# Patient Record
Sex: Male | Born: 1955 | Race: White | Hispanic: No | Marital: Single | Smoking: Former smoker
Health system: Southern US, Community
[De-identification: ages and names within clinical notes are randomized; demographics above are authoritative.]

## PROBLEM LIST (undated history)

## (undated) DIAGNOSIS — B351 Tinea unguium: Secondary | ICD-10-CM

## (undated) DIAGNOSIS — E079 Disorder of thyroid, unspecified: Secondary | ICD-10-CM

## (undated) DIAGNOSIS — C801 Malignant (primary) neoplasm, unspecified: Secondary | ICD-10-CM

## (undated) DIAGNOSIS — N4289 Other specified disorders of prostate: Secondary | ICD-10-CM

## (undated) DIAGNOSIS — Q369 Cleft lip, unilateral: Secondary | ICD-10-CM

## (undated) DIAGNOSIS — K219 Gastro-esophageal reflux disease without esophagitis: Secondary | ICD-10-CM

## (undated) DIAGNOSIS — R569 Unspecified convulsions: Secondary | ICD-10-CM

## (undated) DIAGNOSIS — E039 Hypothyroidism, unspecified: Secondary | ICD-10-CM

## (undated) DIAGNOSIS — N289 Disorder of kidney and ureter, unspecified: Secondary | ICD-10-CM

## (undated) DIAGNOSIS — D696 Thrombocytopenia, unspecified: Secondary | ICD-10-CM

## (undated) DIAGNOSIS — F319 Bipolar disorder, unspecified: Secondary | ICD-10-CM

## (undated) DIAGNOSIS — I10 Essential (primary) hypertension: Secondary | ICD-10-CM

## (undated) HISTORY — DX: Disorder of thyroid, unspecified: E07.9

## (undated) HISTORY — DX: Unspecified convulsions: R56.9

---

## 2017-04-18 ENCOUNTER — Inpatient Hospital Stay: Payer: Medicare Other

## 2017-04-18 ENCOUNTER — Encounter: Payer: Self-pay | Admitting: Oncology

## 2017-04-18 ENCOUNTER — Inpatient Hospital Stay: Payer: Medicare Other | Attending: Oncology | Admitting: Oncology

## 2017-04-18 VITALS — BP 126/85 | HR 75 | Temp 97.5°F | Resp 18 | Ht 62.0 in | Wt 127.0 lb

## 2017-04-18 DIAGNOSIS — Z87891 Personal history of nicotine dependence: Secondary | ICD-10-CM | POA: Diagnosis not present

## 2017-04-18 DIAGNOSIS — D696 Thrombocytopenia, unspecified: Secondary | ICD-10-CM

## 2017-04-18 LAB — FOLATE: Folate: 20.9 ng/mL (ref >5.9)

## 2017-04-18 LAB — IRON AND TIBC
Iron: 57 ug/dL (ref 45–182)
Saturation Ratios: 20 % (ref 17.9–39.5)
TIBC: 282 ug/dL (ref 250–450)
UIBC: 225 ug/dL

## 2017-04-18 LAB — CBC
HEMATOCRIT: 40.7 % (ref 40.0–52.0)
Hemoglobin: 14.1 g/dL (ref 13.0–18.0)
MCH: 31.6 pg (ref 26.0–34.0)
MCHC: 34.5 g/dL (ref 32.0–36.0)
MCV: 91.6 fL (ref 80.0–100.0)
Platelets: 125 10*3/uL — ABNORMAL LOW (ref 150–440)
RBC: 4.44 MIL/uL (ref 4.40–5.90)
RDW: 13.7 % (ref 11.5–14.5)
WBC: 6.1 10*3/uL (ref 3.8–10.6)

## 2017-04-18 LAB — VITAMIN B12: Vitamin B-12: 304 pg/mL (ref 180–914)

## 2017-04-18 LAB — FERRITIN: Ferritin: 775 ng/mL — ABNORMAL HIGH (ref 24–336)

## 2017-04-18 NOTE — Progress Notes (Signed)
[place]  [phone] [phone]  ID: Megan Salon OB: May 06, 1955  MR#: [id number]  GUY#:[id number]  Patient Care Team: Sharyne Peach, MD as PCP - General (Family Medicine)  CHIEF COMPLAINT: Thrombocytopenia.  INTERVAL HISTORY: Patient is a 62 year old male who was noted to have a persistently decreased platelet count on routine blood work.  He currently feels well and is asymptomatic.  He has no neurologic complaints.  He denies any easy bleeding or bruising.  He has a good appetite and denies weight loss.  He denies any recent fevers or illnesses.  He has no chest pain or shortness of breath.  He denies any nausea, vomiting, constipation, or diarrhea.  He has no urinary complaints.  Patient feels at his baseline offers no specific complaints today.  REVIEW OF SYSTEMS:   Review of Systems  Constitutional: Negative.  Negative for fever, malaise/fatigue and weight loss.  Respiratory: Negative.  Negative for cough, hemoptysis and shortness of breath.   Cardiovascular: Negative.  Negative for chest pain.  Gastrointestinal: Negative.  Negative for abdominal pain, blood in stool and melena.  Genitourinary: Negative.  Negative for dysuria.  Musculoskeletal: Negative.   Skin: Negative.  Negative for rash.  Neurological: Negative.  Negative for sensory change, focal weakness and weakness.  Endo/Heme/Allergies: Does not bruise/bleed easily.  Psychiatric/Behavioral: Negative.  The patient is not nervous/anxious.     As per HPI. Otherwise, a complete review of systems is negative.  PAST MEDICAL HISTORY: Past Medical History:  Diagnosis Date  . Seizures ([place])   . Thyroid disease     PAST SURGICAL HISTORY: History reviewed. No pertinent surgical history.  FAMILY HISTORY: History reviewed. No pertinent family history.  ADVANCED DIRECTIVES (Y/N):  N  HEALTH MAINTENANCE: Social History   Tobacco Use  . Smoking status: Former Smoker   Types: Cigarettes  . Smokeless tobacco: Never Used  Substance Use Topics  . Alcohol use: Not on file  . Drug use: Not on file     Colonoscopy:  PAP:  Bone density:  Lipid panel:  Not on File  Current Outpatient Medications  Medication Sig Dispense Refill  . aspirin EC 81 MG tablet Take 81 mg by mouth daily.    Marland Kitchen atenolol (TENORMIN) 25 MG tablet Take 12.5 mg by mouth daily.    . divalproex (DEPAKOTE) 500 MG DR tablet Take 500 mg by mouth 2 (two) times daily.    . finasteride (PROSCAR) 5 MG tablet Take 5 mg by mouth daily.    Marland Kitchen levothyroxine (SYNTHROID, LEVOTHROID) 100 MCG tablet Take 100 mcg by mouth daily before breakfast.    . OLANZapine (ZYPREXA) 15 MG tablet Take 15 mg by mouth at bedtime.    Marland Kitchen omeprazole (PRILOSEC) 20 MG capsule Take 20 mg by mouth daily.    . ranitidine (ZANTAC) 300 MG capsule Take 300 mg by mouth every evening.    . tamsulosin (FLOMAX) 0.4 MG CAPS capsule Take 0.4 mg by mouth.     No current facility-administered medications for this visit.     OBJECTIVE: Vitals:   04/18/17 1355  BP: 126/85  Pulse: 75  Resp: 18  Temp: (!) 97.5 F (36.4 C)  SpO2: 98%     Body mass index is 23.23 kg/m.    ECOG FS:0 - Asymptomatic  General: Well-developed, well-nourished, no acute distress. Eyes: Pink conjunctiva, anicteric sclera. HEENT: Normocephalic, moist mucous membranes, clear oropharnyx. Lungs: Clear to auscultation bilaterally. Heart: Regular rate and rhythm. No rubs, murmurs, or gallops. Abdomen:  Soft, nontender, nondistended. No organomegaly noted, normoactive bowel sounds. Musculoskeletal: No edema, cyanosis, or clubbing. Neuro: Alert, answering all questions appropriately. Cranial nerves grossly intact. Skin: No rashes or petechiae noted. Psych: Normal affect. Lymphatics: No cervical, calvicular, axillary or inguinal LAD.   LAB RESULTS:  No results found for: NA, K, CL, CO2, GLUCOSE, BUN, CREATININE, CALCIUM, PROT, ALBUMIN, AST, ALT, ALKPHOS,  BILITOT, GFRNONAA, GFRAA  Lab Results  Component Value Date   WBC 6.1 04/18/2017   HGB 14.1 04/18/2017   HCT 40.7 04/18/2017   MCV 91.6 04/18/2017   PLT 125 (L) 04/18/2017     STUDIES: No results found.  ASSESSMENT: Thrombocytopenia.  PLAN:    1.  Thrombocytopenia: Upon review of the chart, patient's platelets have been decreased, but relatively stable since at least June 2007 ranging from 108-140s.  More recently the platelet count has trended down and is now 91.  Patient's platelet count from today is back up to his baseline at 125.  Iron stores, B12, and folate are all within normal limits.  Platelet antibody panel is negative.  No intervention is needed at this time.  It is possible patient has mild underlying ITP, but this would require bone marrow biopsy to diagnose which is not necessary at this point.  Can consider splenic ultrasound to assess for splenomegaly.  Return to clinic in 3 months with repeat laboratory work and further evaluation.  If his platelet count remains at his baseline, he likely can be discharged from clinic.  Approximately 45 minutes was spent in discussion of which greater than 50% was consultation.  Patient expressed understanding and was in agreement with this plan. He also understands that He can call clinic at any time with any questions, concerns, or complaints.    Lloyd Huger, MD   04/23/2017 11:27 AM

## 2017-04-19 ENCOUNTER — Encounter: Payer: Medicare Other | Admitting: Oncology

## 2017-04-19 LAB — PLATELET ANTIBODY PROFILE
GLYCOPROTEIN IV ANTIBODY: NEGATIVE
HLA Ab Ser Ql EIA: NEGATIVE
IA/IIA ANTIBODY: NEGATIVE
IB/IX Antibody: NEGATIVE
IIB/IIIA Antibody: NEGATIVE

## 2017-08-11 NOTE — Progress Notes (Signed)
[place]  [phone] [phone]  ID: Jud Fanguy OB: 1955-12-22  MR#: [id number]  NID#:[id number]  Patient Care Team: Sharyne Peach, MD as PCP - General (Family Medicine)  CHIEF COMPLAINT: Thrombocytopenia.  INTERVAL HISTORY: Patient returns to clinic today for repeat laboratory work and further evaluation.  Continues to feel well and remains asymptomatic. He has no neurologic complaints.  He denies any easy bleeding or bruising.  He has a good appetite and denies weight loss.  He denies any recent fevers or illnesses.  He has no chest pain or shortness of breath.  He denies any nausea, vomiting, constipation, or diarrhea.  He has no urinary complaints.  Patient feels at his baseline offers no specific complaints today.  REVIEW OF SYSTEMS:   Review of Systems  Constitutional: Negative.  Negative for fever, malaise/fatigue and weight loss.  Respiratory: Negative.  Negative for cough, hemoptysis and shortness of breath.   Cardiovascular: Negative.  Negative for chest pain.  Gastrointestinal: Negative.  Negative for abdominal pain, blood in stool and melena.  Genitourinary: Negative.  Negative for dysuria.  Musculoskeletal: Negative.   Skin: Negative.  Negative for rash.  Neurological: Negative.  Negative for sensory change, focal weakness and weakness.  Endo/Heme/Allergies: Does not bruise/bleed easily.  Psychiatric/Behavioral: Negative.  The patient is not nervous/anxious.     As per HPI. Otherwise, a complete review of systems is negative.  PAST MEDICAL HISTORY: Past Medical History:  Diagnosis Date  . Seizures ([place])   . Thyroid disease     PAST SURGICAL HISTORY: History reviewed. No pertinent surgical history.  FAMILY HISTORY: History reviewed. No pertinent family history.  ADVANCED DIRECTIVES (Y/N):  N  HEALTH MAINTENANCE: Social History   Tobacco Use  . Smoking status: Former Smoker    Types: Cigarettes  . Smokeless  tobacco: Never Used  Substance Use Topics  . Alcohol use: Not on file  . Drug use: Not on file     Colonoscopy:  PAP:  Bone density:  Lipid panel:  Not on File  Current Outpatient Medications  Medication Sig Dispense Refill  . aspirin EC 81 MG tablet Take 81 mg by mouth daily.    Marland Kitchen atenolol (TENORMIN) 25 MG tablet Take 12.5 mg by mouth daily.    . divalproex (DEPAKOTE) 500 MG DR tablet Take 500 mg by mouth 2 (two) times daily.    . finasteride (PROSCAR) 5 MG tablet Take 5 mg by mouth daily.    Marland Kitchen levothyroxine (SYNTHROID, LEVOTHROID) 100 MCG tablet Take 100 mcg by mouth daily before breakfast.    . OLANZapine (ZYPREXA) 15 MG tablet Take 15 mg by mouth at bedtime.    Marland Kitchen omeprazole (PRILOSEC) 20 MG capsule Take 20 mg by mouth daily.    . ranitidine (ZANTAC) 300 MG capsule Take 300 mg by mouth every evening.    . tamsulosin (FLOMAX) 0.4 MG CAPS capsule Take 0.4 mg by mouth.     No current facility-administered medications for this visit.     OBJECTIVE: Vitals:   08/12/17 1509  BP: 134/83  Pulse: 69  Resp: 20  Temp: 97.8 F (36.6 C)     Body mass index is 23.54 kg/m.    ECOG FS:0 - Asymptomatic  General: Well-developed, well-nourished, no acute distress. Eyes: Pink conjunctiva, anicteric sclera. HEENT: Normocephalic, moist mucous membranes. Lungs: Clear to auscultation bilaterally. Heart: Regular rate and rhythm. No rubs, murmurs, or gallops. Abdomen: Soft, nontender, nondistended. No organomegaly noted, normoactive bowel sounds. Musculoskeletal: No edema,  cyanosis, or clubbing. Neuro: Alert, answering all questions appropriately. Cranial nerves grossly intact. Skin: No rashes or petechiae noted. Psych: Normal affect.  LAB RESULTS:  No results found for: NA, K, CL, CO2, GLUCOSE, BUN, CREATININE, CALCIUM, PROT, ALBUMIN, AST, ALT, ALKPHOS, BILITOT, GFRNONAA, GFRAA  Lab Results  Component Value Date   WBC 6.2 08/12/2017   HGB 12.6 (L) 08/12/2017   HCT 37.4 (L)  08/12/2017   MCV 93.5 08/12/2017   PLT 111 (L) 08/12/2017     STUDIES: No results found.  ASSESSMENT: Thrombocytopenia.  PLAN:    1.  Thrombocytopenia: Today's platelet count is 111.  Upon review of the chart, patient's platelets have been decreased, but relatively stable since at least June 2007 ranging from 108-140s.  Previously, iron stores, B12, and folate are all within normal limits.  Platelet antibody panel is negative.  No intervention is needed at this time.  It is possible patient has mild underlying ITP, but this would require bone marrow biopsy to diagnose which is not necessary at this point.  Can consider splenic ultrasound to assess for splenomegaly.  Given the chronicity of patient's problem, it was agreed upon that no further follow-up is necessary.  Please continue to monitor CBC once or twice per year and refer patient back if there are any questions or concerns.  I spent a total of 20 minutes face-to-face with the patient of which greater than 50% of the visit was spent in counseling and coordination of care as detailed above.   Patient expressed understanding and was in agreement with this plan. He also understands that He can call clinic at any time with any questions, concerns, or complaints.    Lloyd Huger, MD   08/16/2017 2:14 PM

## 2017-08-12 ENCOUNTER — Inpatient Hospital Stay: Payer: Medicare Other | Attending: Oncology

## 2017-08-12 ENCOUNTER — Inpatient Hospital Stay (HOSPITAL_BASED_OUTPATIENT_CLINIC_OR_DEPARTMENT_OTHER): Payer: Medicare Other | Admitting: Oncology

## 2017-08-12 ENCOUNTER — Encounter: Payer: Self-pay | Admitting: Oncology

## 2017-08-12 VITALS — BP 134/83 | HR 69 | Temp 97.8°F | Resp 20 | Wt 128.7 lb

## 2017-08-12 DIAGNOSIS — D696 Thrombocytopenia, unspecified: Secondary | ICD-10-CM

## 2017-08-12 DIAGNOSIS — Z87891 Personal history of nicotine dependence: Secondary | ICD-10-CM | POA: Insufficient documentation

## 2017-08-12 LAB — CBC
HCT: 37.4 % — ABNORMAL LOW (ref 40.0–52.0)
HEMOGLOBIN: 12.6 g/dL — AB (ref 13.0–18.0)
MCH: 31.6 pg (ref 26.0–34.0)
MCHC: 33.8 g/dL (ref 32.0–36.0)
MCV: 93.5 fL (ref 80.0–100.0)
Platelets: 111 10*3/uL — ABNORMAL LOW (ref 150–440)
RBC: 3.99 MIL/uL — AB (ref 4.40–5.90)
RDW: 13.6 % (ref 11.5–14.5)
WBC: 6.2 10*3/uL (ref 3.8–10.6)

## 2017-08-12 NOTE — Progress Notes (Signed)
Patient denies any concerns today.  

## 2017-11-11 ENCOUNTER — Other Ambulatory Visit: Payer: Self-pay | Admitting: Gastroenterology

## 2017-11-11 DIAGNOSIS — D696 Thrombocytopenia, unspecified: Secondary | ICD-10-CM

## 2017-11-20 ENCOUNTER — Ambulatory Visit
Admission: RE | Admit: 2017-11-20 | Discharge: 2017-11-20 | Disposition: A | Discharge status: Home / Self Care | Payer: Medicare Other | Source: Ambulatory Visit | Attending: Gastroenterology | Admitting: Gastroenterology

## 2017-11-20 DIAGNOSIS — N281 Cyst of kidney, acquired: Secondary | ICD-10-CM | POA: Insufficient documentation

## 2017-11-20 DIAGNOSIS — D696 Thrombocytopenia, unspecified: Secondary | ICD-10-CM | POA: Insufficient documentation

## 2018-01-14 ENCOUNTER — Ambulatory Visit: Payer: Medicare Other | Admitting: Anesthesiology

## 2018-01-14 ENCOUNTER — Ambulatory Visit
Admission: RE | Admit: 2018-01-14 | Discharge: 2018-01-14 | Disposition: A | Discharge status: Home / Self Care | Payer: Medicare Other | Attending: Gastroenterology | Admitting: Gastroenterology

## 2018-01-14 ENCOUNTER — Encounter
Admission: RE | Disposition: A | Discharge status: Home / Self Care | Payer: Self-pay | Source: Home / Self Care | Attending: Gastroenterology

## 2018-01-14 ENCOUNTER — Encounter: Payer: Self-pay | Admitting: Student

## 2018-01-14 ENCOUNTER — Other Ambulatory Visit: Payer: Self-pay

## 2018-01-14 DIAGNOSIS — E039 Hypothyroidism, unspecified: Secondary | ICD-10-CM | POA: Insufficient documentation

## 2018-01-14 DIAGNOSIS — Z79899 Other long term (current) drug therapy: Secondary | ICD-10-CM | POA: Diagnosis not present

## 2018-01-14 DIAGNOSIS — F319 Bipolar disorder, unspecified: Secondary | ICD-10-CM | POA: Diagnosis not present

## 2018-01-14 DIAGNOSIS — R569 Unspecified convulsions: Secondary | ICD-10-CM | POA: Diagnosis not present

## 2018-01-14 DIAGNOSIS — T184XXA Foreign body in colon, initial encounter: Secondary | ICD-10-CM | POA: Diagnosis not present

## 2018-01-14 DIAGNOSIS — I10 Essential (primary) hypertension: Secondary | ICD-10-CM | POA: Insufficient documentation

## 2018-01-14 DIAGNOSIS — X58XXXA Exposure to other specified factors, initial encounter: Secondary | ICD-10-CM | POA: Diagnosis not present

## 2018-01-14 DIAGNOSIS — Z7982 Long term (current) use of aspirin: Secondary | ICD-10-CM | POA: Diagnosis not present

## 2018-01-14 DIAGNOSIS — K219 Gastro-esophageal reflux disease without esophagitis: Secondary | ICD-10-CM | POA: Insufficient documentation

## 2018-01-14 DIAGNOSIS — Z1211 Encounter for screening for malignant neoplasm of colon: Secondary | ICD-10-CM | POA: Diagnosis present

## 2018-01-14 HISTORY — DX: Tinea unguium: B35.1

## 2018-01-14 HISTORY — DX: Unspecified convulsions: R56.9

## 2018-01-14 HISTORY — DX: Cleft lip, unilateral: Q36.9

## 2018-01-14 HISTORY — DX: Disorder of kidney and ureter, unspecified: N28.9

## 2018-01-14 HISTORY — DX: Bipolar disorder, unspecified: F31.9

## 2018-01-14 HISTORY — DX: Gastro-esophageal reflux disease without esophagitis: K21.9

## 2018-01-14 HISTORY — PX: COLONOSCOPY WITH PROPOFOL: SHX5780

## 2018-01-14 HISTORY — DX: Malignant (primary) neoplasm, unspecified: C80.1

## 2018-01-14 HISTORY — DX: Thrombocytopenia, unspecified: D69.6

## 2018-01-14 HISTORY — DX: Other specified disorders of prostate: N42.89

## 2018-01-14 HISTORY — DX: Hypothyroidism, unspecified: E03.9

## 2018-01-14 HISTORY — DX: Essential (primary) hypertension: I10

## 2018-01-14 LAB — CBC WITH DIFFERENTIAL/PLATELET
Abs Immature Granulocytes: 0.02 10*3/uL (ref 0.00–0.07)
Basophils Absolute: 0 10*3/uL (ref 0.0–0.1)
Basophils Relative: 0 %
Eosinophils Absolute: 0.1 10*3/uL (ref 0.0–0.5)
Eosinophils Relative: 2 %
HCT: 38.5 % — ABNORMAL LOW (ref 39.0–52.0)
Hemoglobin: 12.7 g/dL — ABNORMAL LOW (ref 13.0–17.0)
Immature Granulocytes: 0 %
LYMPHS PCT: 31 %
Lymphs Abs: 1.7 10*3/uL (ref 0.7–4.0)
MCH: 31.1 pg (ref 26.0–34.0)
MCHC: 33 g/dL (ref 30.0–36.0)
MCV: 94.1 fL (ref 80.0–100.0)
Monocytes Absolute: 0.7 10*3/uL (ref 0.1–1.0)
Monocytes Relative: 12 %
Neutro Abs: 2.9 10*3/uL (ref 1.7–7.7)
Neutrophils Relative %: 55 %
Platelets: 98 10*3/uL — ABNORMAL LOW (ref 150–400)
RBC: 4.09 MIL/uL — AB (ref 4.22–5.81)
RDW: 12.4 % (ref 11.5–15.5)
WBC: 5.4 10*3/uL (ref 4.0–10.5)
nRBC: 0 % (ref 0.0–0.2)

## 2018-01-14 LAB — PROTIME-INR
INR: 0.99
PROTHROMBIN TIME: 13 s (ref 11.4–15.2)

## 2018-01-14 SURGERY — COLONOSCOPY WITH PROPOFOL
Anesthesia: General

## 2018-01-14 MED ORDER — PROPOFOL 10 MG/ML IV BOLUS
INTRAVENOUS | Status: DC | PRN
  Administered 2018-01-14: 50 mg via INTRAVENOUS

## 2018-01-14 MED ORDER — SODIUM CHLORIDE 0.9 % IV SOLN
INTRAVENOUS | Status: DC
  Administered 2018-01-14: 12:00:00 via INTRAVENOUS

## 2018-01-14 MED ORDER — FENTANYL CITRATE (PF) 100 MCG/2ML IJ SOLN
INTRAMUSCULAR | Status: DC | PRN
  Administered 2018-01-14: 50 ug via INTRAVENOUS

## 2018-01-14 MED ORDER — PROPOFOL 500 MG/50ML IV EMUL
INTRAVENOUS | Status: AC
  Filled 2018-01-14: qty 50

## 2018-01-14 MED ORDER — PROPOFOL 500 MG/50ML IV EMUL
INTRAVENOUS | Status: DC | PRN
  Administered 2018-01-14: 80 ug/kg/min via INTRAVENOUS

## 2018-01-14 MED ORDER — FENTANYL CITRATE (PF) 100 MCG/2ML IJ SOLN
INTRAMUSCULAR | Status: AC
  Filled 2018-01-14: qty 2

## 2018-01-14 NOTE — H&P (Signed)
Outpatient short stay form Pre-procedure 01/14/2018 12:15 PM Lollie Sails MD   Primary Physician: Dr. Salome Holmes  Reason for visit: Colonoscopy  History of present illness: Patient is a 63 year old male presenting today for colonoscopy for colon cancer screening.  Patient does have a history of thrombocytopenia and a single mutation for hereditary hemochromatosis.  Coags checked this morning include platelets at 98 and pro time of 13.0 INR of 0.99.  His neutrophil is about 3.0.  Tolerated his prep well.  He takes no aspirin or blood thinning agent with the exception of 81 mg aspirin that has been held for a week.    Current Facility-Administered Medications:  .  0.9 %  sodium chloride infusion, , Intravenous, Continuous, Lollie Sails, MD .  0.9 %  sodium chloride infusion, , Intravenous, Continuous, Lollie Sails, MD, Last Rate: 20 mL/hr at 01/14/18 1142  Medications Prior to Admission  Medication Sig Dispense Refill Last Dose  . aspirin EC 81 MG tablet Take 81 mg by mouth daily.   Past Week at Unknown time  . atenolol (TENORMIN) 25 MG tablet Take 12.5 mg by mouth daily.   01/14/2018 at Unknown time  . divalproex (DEPAKOTE) 500 MG DR tablet Take 1,000 mg by mouth at bedtime.    01/13/2018 at Unknown time  . famotidine (PEPCID) 20 MG tablet Take 20 mg by mouth 2 (two) times daily.   01/13/2018 at Unknown time  . finasteride (PROSCAR) 5 MG tablet Take 5 mg by mouth daily.   01/14/2018 at lorazepam  . levothyroxine (SYNTHROID, LEVOTHROID) 100 MCG tablet Take 100 mcg by mouth daily before breakfast.   01/14/2018 at Unknown time  . LORazepam (ATIVAN) 2 MG tablet Take 2 mg by mouth every 8 (eight) hours as needed for anxiety.   Past Month at Unknown time  . OLANZapine (ZYPREXA) 15 MG tablet Take 15 mg by mouth at bedtime.   01/13/2018 at Unknown time  . omeprazole (PRILOSEC) 20 MG capsule Take 20 mg by mouth daily.   01/14/2018 at Unknown time  . ranitidine (ZANTAC) 300 MG capsule  Take 300 mg by mouth every evening.   Not Taking at Unknown time  . tamsulosin (FLOMAX) 0.4 MG CAPS capsule Take 0.4 mg by mouth.   Completed Course at Unknown time     Not on File   Past Medical History:  Diagnosis Date  . Bipolar 1 disorder ([place])   . Cancer [facility])    prostate  . Cleft lip   . Cleft lip   . GERD (gastroesophageal reflux disease)   . Hypertension   . Hypothyroidism   . Onychomycosis   . Prostate mass   . Renal insufficiency   . Seizure ([place])   . Seizures ([place])   . Thrombocytopenia ([place])   . Thyroid disease     Review of systems:      Physical Exam    Heart and lungs: Regular rate and rhythm without rub or gallop, lungs are bilaterally clear.    HEENT: Normocephalic atraumatic eyes are anicteric    Other:    Pertinant exam for procedure: Soft nontender nondistended bowel sounds positive normoactive    Planned proceedures: Colonoscopy and indicated procedures. I have discussed the risks benefits and complications of procedures to include not limited to bleeding, infection, perforation and the risk of sedation and the patient wishes to proceed.    Lollie Sails, MD Gastroenterology 01/14/2018  12:15 PM

## 2018-01-14 NOTE — Transfer of Care (Signed)
Immediate Anesthesia Transfer of Care Note  Patient: Dylan Mahoney  Procedure(s) Performed: COLONOSCOPY WITH PROPOFOL (N/A )  Patient Location: PACU  Anesthesia Type:General  Level of Consciousness: awake  Airway & Oxygen Therapy: Patient Spontanous Breathing  Post-op Assessment: Report given to RN  Post vital signs: Reviewed and stable  Last Vitals:  Vitals Value Taken Time  BP    Temp    Pulse    Resp    SpO2      Last Pain:  Vitals:   01/14/18 1119  TempSrc: Tympanic  PainSc: 0-No pain         Complications: No apparent anesthesia complications

## 2018-01-14 NOTE — Anesthesia Post-op Follow-up Note (Signed)
Anesthesia QCDR form completed.        

## 2018-01-14 NOTE — Anesthesia Preprocedure Evaluation (Addendum)
Anesthesia Evaluation  Patient identified by MRN, date of birth, ID band Patient awake    Reviewed: Allergy & Precautions, H&P , NPO status , Patient's Chart, lab work & pertinent test results, reviewed documented beta blocker date and time   History of Anesthesia Complications Negative for: history of anesthetic complications  Airway Mallampati: II   Neck ROM: full    Dental  (+) Edentulous Upper, Edentulous Lower   Pulmonary neg pulmonary ROS, neg sleep apnea, neg COPD, former smoker,    Pulmonary exam normal        Cardiovascular Exercise Tolerance: Good hypertension, On Medications (-) CAD, (-) Past MI, (-) Cardiac Stents and (-) CABG negative cardio ROS Normal cardiovascular exam Rhythm:regular Rate:Normal     Neuro/Psych Seizures -, Well Controlled,  PSYCHIATRIC DISORDERS Bipolar Disorder    GI/Hepatic Neg liver ROS, GERD  Medicated,  Endo/Other  Hypothyroidism   Renal/GU negative Renal ROS  negative genitourinary   Musculoskeletal negative musculoskeletal ROS (+)   Abdominal (+) - obese,   Peds  Hematology negative hematology ROS (+)   Anesthesia Other Findings Past Medical History: No date: Bipolar 1 disorder ([facility]) No date: Cancer ([place])     Comment:  prostate No date: Cleft lip No date: GERD (gastroesophageal reflux disease) No date: Hypertension No date: Hypothyroidism No date: Seizure ([place]) No date: Seizures ([place]) No date: Thrombocytopenia ([place]) No date: Thyroid disease History reviewed. No pertinent surgical history.   Reproductive/Obstetrics negative OB ROS                            Anesthesia Physical Anesthesia Plan  ASA: III  Anesthesia Plan: General   Post-op Pain Management:    Induction: Intravenous  PONV Risk Score and Plan: 1 and Propofol infusion  Airway Management Planned: Natural Airway  Additional Equipment:   Intra-op Plan:    Post-operative Plan:   Informed Consent: I have reviewed the patients History and Physical, chart, labs and discussed the procedure including the risks, benefits and alternatives for the proposed anesthesia with the patient or authorized representative who has indicated his/her understanding and acceptance.     Dental Advisory Given and Consent reviewed with POA  Plan Discussed with: CRNA  Anesthesia Plan Comments:        Anesthesia Quick Evaluation

## 2018-01-14 NOTE — Op Note (Signed)
[facility] Gastroenterology Patient Name: Dylan Mahoney Procedure Date: 01/14/2018 9:23 AM MRN: [id number] Account #: [id number] Date of Birth: May 05, 1955 Admit Type: Outpatient Age: 63 Room: [facility] ENDO ROOM 3 Gender: Male Note Status: Finalized Procedure:            Colonoscopy Indications:          Screening for colorectal malignant neoplasm Providers:            Lollie Sails, MD Medicines:            Monitored Anesthesia Care Complications:        No immediate complications. Procedure:            Pre-Anesthesia Assessment:                       - ASA Grade Assessment: III - A patient with severe                        systemic disease.                       After obtaining informed consent, the colonoscope was                        passed under direct vision. Throughout the procedure,                        the patient's blood pressure, pulse, and oxygen                        saturations were monitored continuously. The                        Colonoscope was introduced through the anus and                        advanced to the the cecum, identified by appendiceal                        orifice and ileocecal valve. The colonoscopy was                        performed without difficulty. The patient tolerated the                        procedure well. The quality of the bowel preparation                        was good. Findings:      The colon (entire examined portion) appeared normal, although the       colonic lumen is somewhat generous, and there is a small /minimal amount       of melanosis in the left colon.      The retroflexed view of the distal rectum and anal verge was normal and       showed no anal or rectal abnormalities.      A foreign body was found in the cecum, this seeming to be a small       chicken rib bone. Impression:           - The entire examined colon is normal.                       -  The distal rectum and anal verge are  normal on                        retroflexion view.                       - Foreign body in the cecum.                       - No specimens collected. Recommendation:       - Discharge patient to home.                       - Soft diet for 3 days, then advance as tolerated to                        advance diet as tolerated. Procedure Code(s):    --- Professional ---                       [phone], Colonoscopy, flexible; diagnostic, including                        collection of specimen(s) by brushing or washing, when                        performed (separate procedure) Diagnosis Code(s):    --- Professional ---                       Z12.11, Encounter for screening for malignant neoplasm                        of colon                       T18.4XXA, Foreign body in colon, initial encounter CPT copyright 2018 American Medical Association. All rights reserved. The codes documented in this report are preliminary and upon coder review may  be revised to meet current compliance requirements. Lollie Sails, MD 01/14/2018 12:57:16 PM This report has been signed electronically. Number of Addenda: 0 Note Initiated On: 01/14/2018 9:23 AM Scope Withdrawal Time: 0 hours 5 minutes 46 seconds  Total Procedure Duration: 0 hours 21 minutes 22 seconds       [facility]

## 2018-01-15 ENCOUNTER — Encounter: Payer: Self-pay | Admitting: Gastroenterology

## 2018-01-15 NOTE — Anesthesia Postprocedure Evaluation (Signed)
Anesthesia Post Note  Patient: Dylan Mahoney  Procedure(s) Performed: COLONOSCOPY WITH PROPOFOL (N/A )  Patient location during evaluation: PACU Anesthesia Type: General Level of consciousness: awake and alert Pain management: pain level controlled Vital Signs Assessment: post-procedure vital signs reviewed and stable Respiratory status: spontaneous breathing, nonlabored ventilation, respiratory function stable and patient connected to nasal cannula oxygen Cardiovascular status: blood pressure returned to baseline and stable Postop Assessment: no apparent nausea or vomiting Anesthetic complications: no     Last Vitals:  Vitals:   01/14/18 1313 01/14/18 1323  BP: 140/80 105/74  Pulse: 75 73  Resp: (!) 22 16  Temp:    SpO2: 100% 98%    Last Pain:  Vitals:   01/14/18 1313  TempSrc:   PainSc: 0-No pain                 Molli Barrows

## 2018-11-28 IMAGING — US US ABDOMEN COMPLETE
1 series · 14 of 25 positions shown · non-contrast
Comparison: None.

CLINICAL DATA: Initial evaluation for thrombocytopenia.

EXAM:
ABDOMEN ULTRASOUND COMPLETE

[Series 1: us abdomen complete · 0.19mm/px · 14 of 82 slices shown]
[im 1/82]
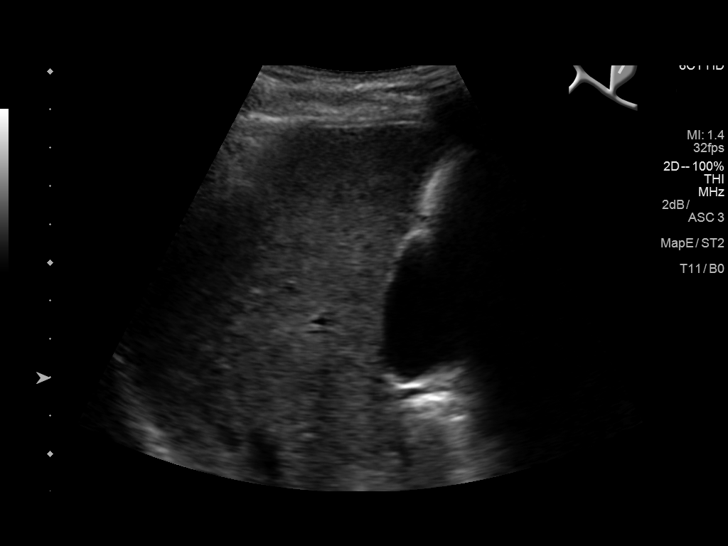
[im 7/82]
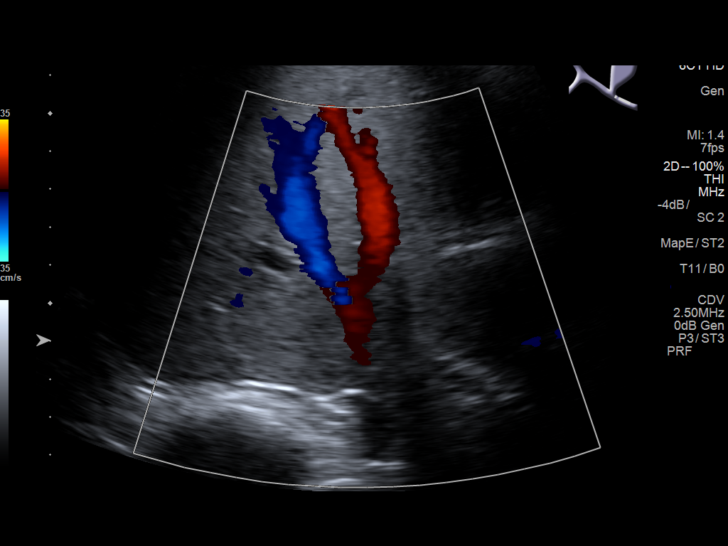
[im 14/82]
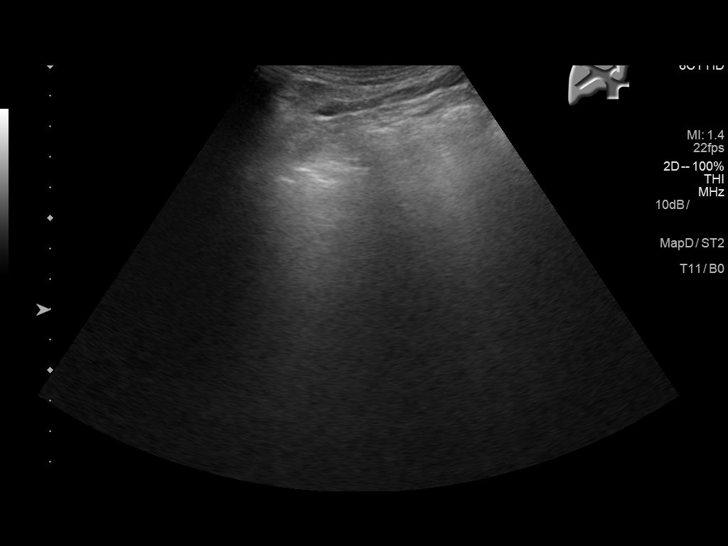
[im 21/82]
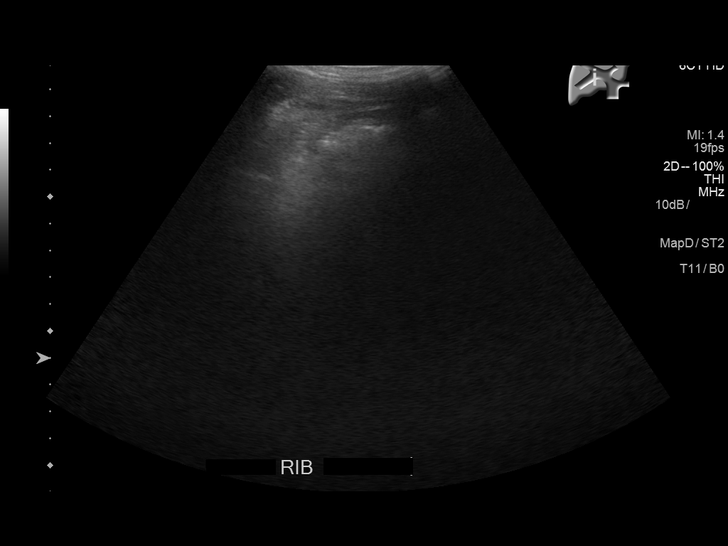
[im 28/82]
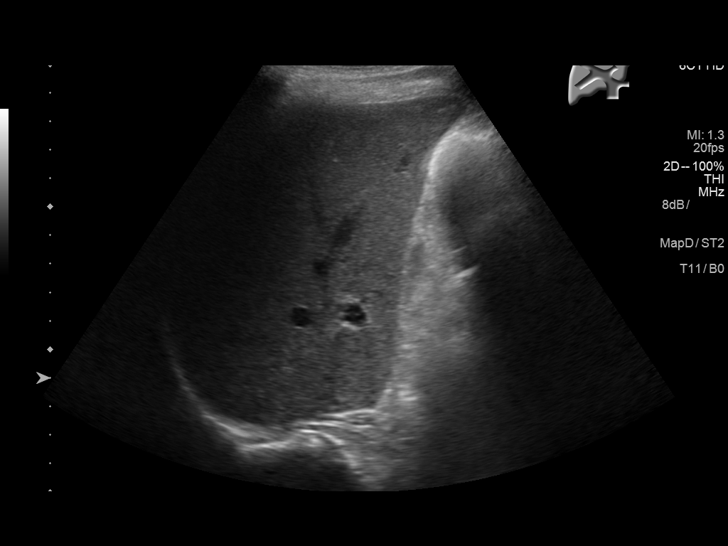
[im 31/82]
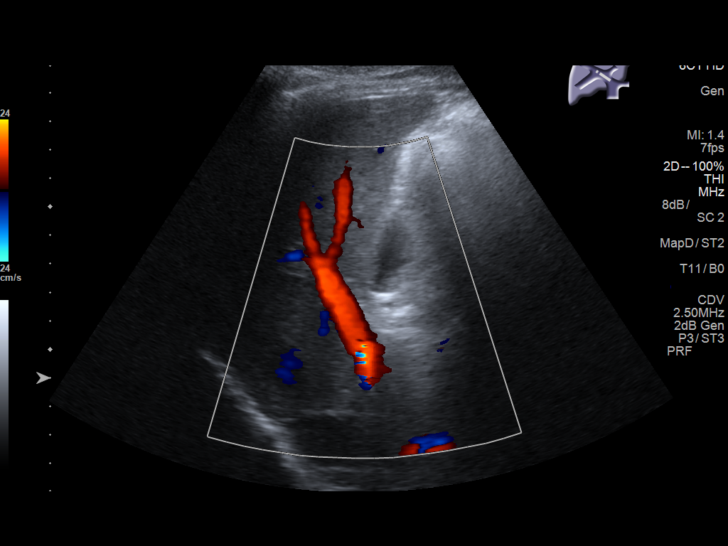
[im 38/82]
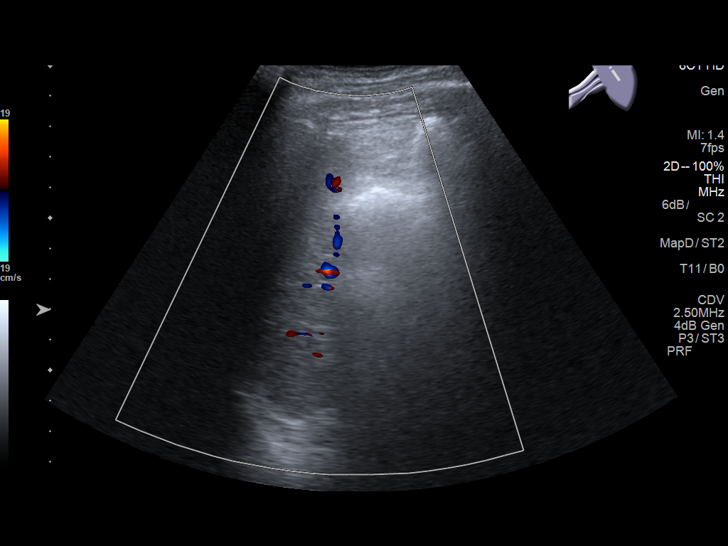
[im 44/82]
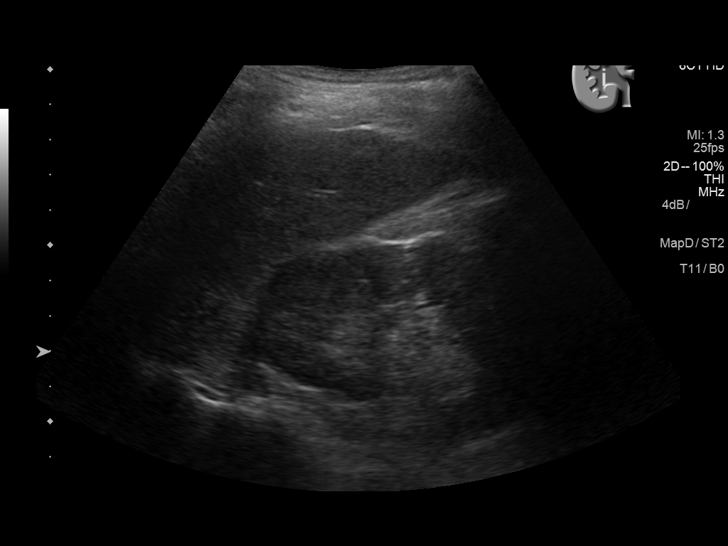
[im 51/82]
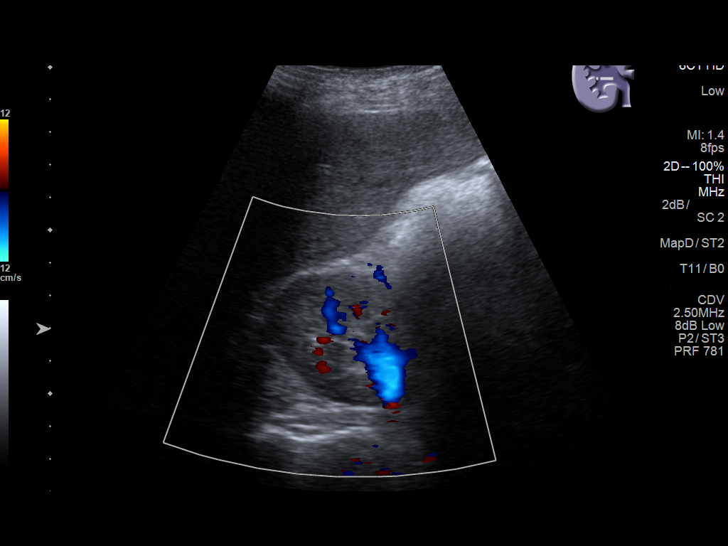
[im 55/82]
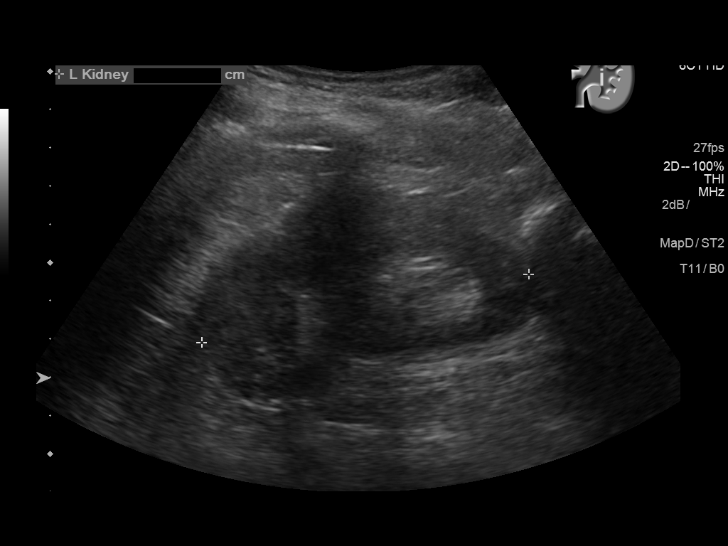
[im 61/82]
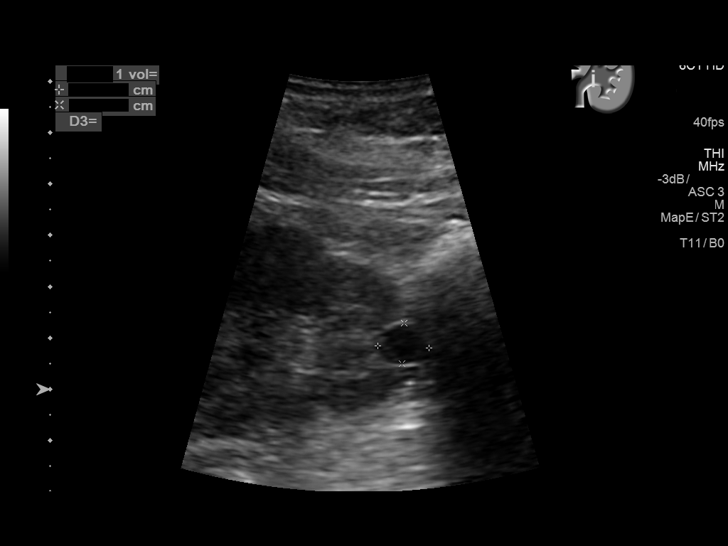
[im 68/82]
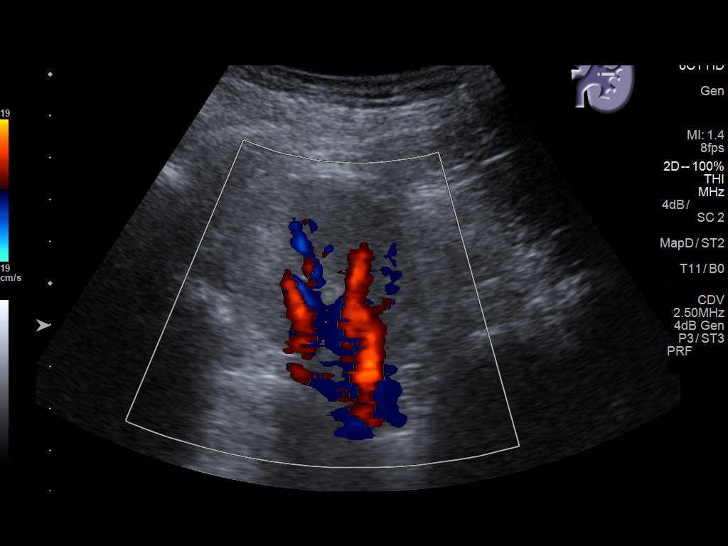
[im 75/82]
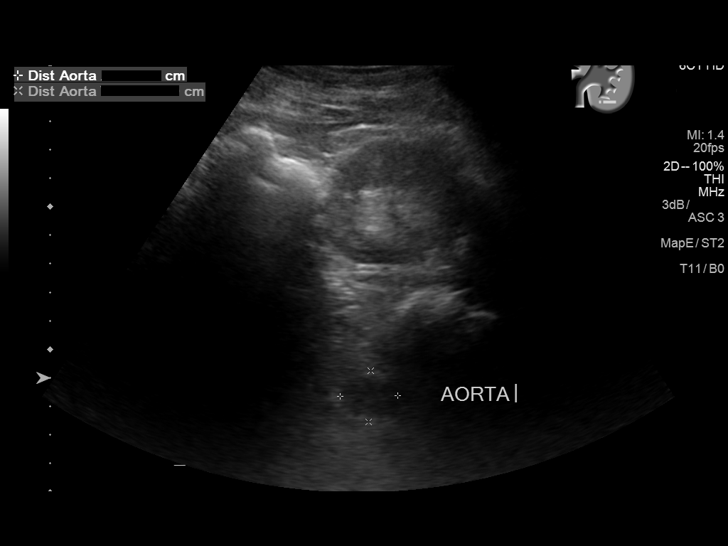
[im 82/82]
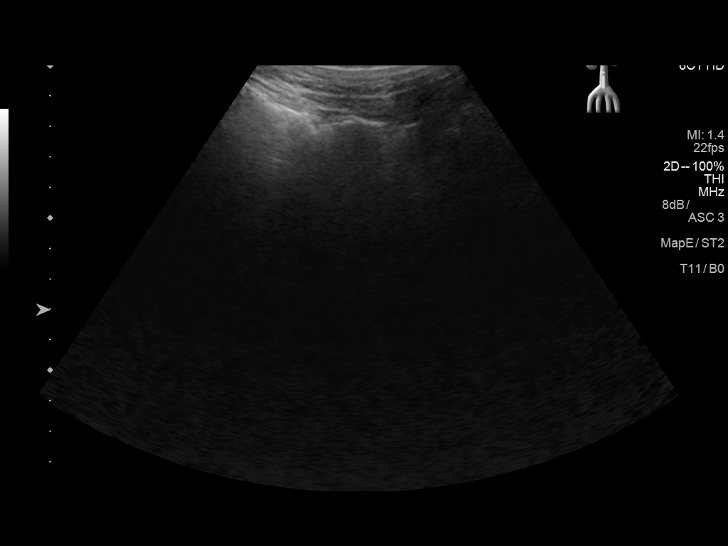

[14 of 25 positions shown; findings below may reference images not displayed]

FINDINGS: Gallbladder: No gallstones or wall thickening visualized. No
sonographic Murphy sign noted by sonographer.

Common bile duct: Diameter: 2.5 mm

Liver: No focal lesion identified. Within normal limits in
parenchymal echogenicity. Portal vein is patent on color Doppler
imaging with normal direction of blood flow towards the liver.

IVC: No abnormality visualized.

Pancreas: Not visualized due to overlying bowel gas.

Spleen: Size and appearance within normal limits.

Right Kidney: Length: 9.1 cm. Echogenicity within normal limits. No
mass or hydronephrosis visualized.

Left Kidney: Length: 8.7 cm. Echogenicity within normal limits. No
hydronephrosis. 1.0 x 0.8 x 0.8 cm simple cyst present at the lower
pole.

Abdominal aorta: No aneurysm visualized.

Other findings: None.
IMPRESSION: 1. 1 cm simple left renal cyst.
2. Otherwise unremarkable and normal abdominal ultrasound.

## 2022-09-14 ENCOUNTER — Encounter (INDEPENDENT_AMBULATORY_CARE_PROVIDER_SITE_OTHER): Payer: Self-pay | Admitting: *Deleted

## 2023-03-14 ENCOUNTER — Encounter (INDEPENDENT_AMBULATORY_CARE_PROVIDER_SITE_OTHER): Payer: Self-pay | Admitting: *Deleted

## 2023-09-13 ENCOUNTER — Other Ambulatory Visit (HOSPITAL_COMMUNITY): Payer: Self-pay | Admitting: Gerontology

## 2023-09-13 ENCOUNTER — Ambulatory Visit (HOSPITAL_COMMUNITY)
Admission: RE | Admit: 2023-09-13 | Discharge: 2023-09-13 | Disposition: A | Discharge status: Home / Self Care | Source: Ambulatory Visit | Attending: Gerontology | Admitting: Gerontology

## 2023-09-13 DIAGNOSIS — R52 Pain, unspecified: Secondary | ICD-10-CM | POA: Insufficient documentation

## 2023-12-26 ENCOUNTER — Other Ambulatory Visit: Payer: Self-pay

## 2023-12-26 ENCOUNTER — Emergency Department (HOSPITAL_COMMUNITY)

## 2023-12-26 ENCOUNTER — Inpatient Hospital Stay (HOSPITAL_COMMUNITY)
Admission: EM | Admit: 2023-12-26 | Discharge: 2024-01-01 | DRG: 193 | Disposition: A | Discharge status: Skilled Nursing Facility | Source: Skilled Nursing Facility | Attending: Internal Medicine | Admitting: Internal Medicine

## 2023-12-26 ENCOUNTER — Encounter (HOSPITAL_COMMUNITY): Payer: Self-pay

## 2023-12-26 DIAGNOSIS — F259 Schizoaffective disorder, unspecified: Secondary | ICD-10-CM | POA: Diagnosis present

## 2023-12-26 DIAGNOSIS — Z79890 Hormone replacement therapy (postmenopausal): Secondary | ICD-10-CM

## 2023-12-26 DIAGNOSIS — K219 Gastro-esophageal reflux disease without esophagitis: Secondary | ICD-10-CM | POA: Diagnosis present

## 2023-12-26 DIAGNOSIS — E039 Hypothyroidism, unspecified: Secondary | ICD-10-CM | POA: Diagnosis present

## 2023-12-26 DIAGNOSIS — F319 Bipolar disorder, unspecified: Secondary | ICD-10-CM | POA: Diagnosis present

## 2023-12-26 DIAGNOSIS — I10 Essential (primary) hypertension: Secondary | ICD-10-CM | POA: Diagnosis present

## 2023-12-26 DIAGNOSIS — W19XXXA Unspecified fall, initial encounter: Secondary | ICD-10-CM | POA: Diagnosis present

## 2023-12-26 DIAGNOSIS — Z87891 Personal history of nicotine dependence: Secondary | ICD-10-CM

## 2023-12-26 DIAGNOSIS — Z87730 Personal history of (corrected) cleft lip and palate: Secondary | ICD-10-CM

## 2023-12-26 DIAGNOSIS — Z7982 Long term (current) use of aspirin: Secondary | ICD-10-CM

## 2023-12-26 DIAGNOSIS — J101 Influenza due to other identified influenza virus with other respiratory manifestations: Principal | ICD-10-CM | POA: Diagnosis present

## 2023-12-26 DIAGNOSIS — R6510 Systemic inflammatory response syndrome (SIRS) of non-infectious origin without acute organ dysfunction: Secondary | ICD-10-CM | POA: Diagnosis present

## 2023-12-26 DIAGNOSIS — R0902 Hypoxemia: Secondary | ICD-10-CM

## 2023-12-26 DIAGNOSIS — Z79899 Other long term (current) drug therapy: Secondary | ICD-10-CM | POA: Diagnosis not present

## 2023-12-26 DIAGNOSIS — J9601 Acute respiratory failure with hypoxia: Secondary | ICD-10-CM | POA: Diagnosis present

## 2023-12-26 DIAGNOSIS — Y92129 Unspecified place in nursing home as the place of occurrence of the external cause: Secondary | ICD-10-CM

## 2023-12-26 DIAGNOSIS — J111 Influenza due to unidentified influenza virus with other respiratory manifestations: Principal | ICD-10-CM

## 2023-12-26 LAB — COMPREHENSIVE METABOLIC PANEL WITH GFR
ALT: 51 U/L — ABNORMAL HIGH (ref 0–44)
AST: 76 U/L — ABNORMAL HIGH (ref 15–41)
Albumin: 3.9 g/dL (ref 3.5–5.0)
Alkaline Phosphatase: 57 U/L (ref 38–126)
Anion gap: 14 (ref 5–15)
BUN: 21 mg/dL (ref 8–23)
CO2: 23 mmol/L (ref 22–32)
Calcium: 8.8 mg/dL — ABNORMAL LOW (ref 8.9–10.3)
Chloride: 102 mmol/L (ref 98–111)
Creatinine, Ser: 1.45 mg/dL — ABNORMAL HIGH (ref 0.61–1.24)
GFR, Estimated: 53 mL/min — ABNORMAL LOW
Glucose, Bld: 111 mg/dL — ABNORMAL HIGH (ref 70–99)
Potassium: 4.5 mmol/L (ref 3.5–5.1)
Sodium: 139 mmol/L (ref 135–145)
Total Bilirubin: 0.4 mg/dL (ref 0.0–1.2)
Total Protein: 7.4 g/dL (ref 6.5–8.1)

## 2023-12-26 LAB — CBC
HCT: 41.8 % (ref 39.0–52.0)
Hemoglobin: 13.3 g/dL (ref 13.0–17.0)
MCH: 30.1 pg (ref 26.0–34.0)
MCHC: 31.8 g/dL (ref 30.0–36.0)
MCV: 94.6 fL (ref 80.0–100.0)
Platelets: 112 K/uL — ABNORMAL LOW (ref 150–400)
RBC: 4.42 MIL/uL (ref 4.22–5.81)
RDW: 15.2 % (ref 11.5–15.5)
WBC: 8.2 K/uL (ref 4.0–10.5)
nRBC: 0 % (ref 0.0–0.2)

## 2023-12-26 LAB — URINALYSIS, ROUTINE W REFLEX MICROSCOPIC
Bilirubin Urine: NEGATIVE
Glucose, UA: NEGATIVE mg/dL
Hgb urine dipstick: NEGATIVE
Ketones, ur: NEGATIVE mg/dL
Leukocytes,Ua: NEGATIVE
Nitrite: NEGATIVE
Protein, ur: NEGATIVE mg/dL
Specific Gravity, Urine: 1.011 (ref 1.005–1.030)
pH: 5 (ref 5.0–8.0)

## 2023-12-26 LAB — LACTIC ACID, PLASMA
Lactic Acid, Venous: 1.7 mmol/L (ref 0.5–1.9)
Lactic Acid, Venous: 1.1 mmol/L (ref 0.5–1.9)

## 2023-12-26 LAB — RESP PANEL BY RT-PCR (RSV, FLU A&B, COVID)  RVPGX2
Influenza A by PCR: POSITIVE — AB
Influenza B by PCR: NEGATIVE
Resp Syncytial Virus by PCR: NEGATIVE
SARS Coronavirus 2 by RT PCR: NEGATIVE

## 2023-12-26 MED ORDER — IPRATROPIUM-ALBUTEROL 0.5-2.5 (3) MG/3ML IN SOLN
3.0000 mL | Freq: Once | RESPIRATORY_TRACT | Status: AC
  Administered 2023-12-26: 3 mL via RESPIRATORY_TRACT
  Filled 2023-12-26: qty 3

## 2023-12-26 MED ORDER — ACETAMINOPHEN 500 MG PO TABS
1000.0000 mg | ORAL_TABLET | Freq: Once | ORAL | Status: AC
  Administered 2023-12-26: 1000 mg via ORAL
  Filled 2023-12-26: qty 2

## 2023-12-26 MED ORDER — GUAIFENESIN ER 600 MG PO TB12
600.0000 mg | ORAL_TABLET | Freq: Two times a day (BID) | ORAL | Status: DC
  Administered 2023-12-26 – 2024-01-01 (×13): 600 mg via ORAL
  Filled 2023-12-26 (×10): qty 1

## 2023-12-26 MED ORDER — SENNOSIDES-DOCUSATE SODIUM 8.6-50 MG PO TABS: 1.0000 | ORAL_TABLET | Freq: Every evening | ORAL | Status: DC | PRN

## 2023-12-26 MED ORDER — ONDANSETRON HCL 4 MG PO TABS
4.0000 mg | ORAL_TABLET | Freq: Four times a day (QID) | ORAL | Status: DC | PRN
  Administered 2024-01-01: 4 mg via ORAL

## 2023-12-26 MED ORDER — ACETAMINOPHEN 650 MG RE SUPP: 650.0000 mg | Freq: Four times a day (QID) | RECTAL | Status: DC | PRN

## 2023-12-26 MED ORDER — SODIUM CHLORIDE 0.9 % IV SOLN
500.0000 mg | INTRAVENOUS | Status: DC
  Administered 2023-12-26 – 2023-12-28 (×3): 500 mg via INTRAVENOUS
  Filled 2023-12-26 (×3): qty 5

## 2023-12-26 MED ORDER — BISACODYL 5 MG PO TBEC: 5.0000 mg | DELAYED_RELEASE_TABLET | Freq: Every day | ORAL | Status: DC | PRN

## 2023-12-26 MED ORDER — ONDANSETRON HCL 4 MG/2ML IJ SOLN
4.0000 mg | Freq: Four times a day (QID) | INTRAMUSCULAR | Status: DC | PRN
  Administered 2023-12-30: 4 mg via INTRAVENOUS

## 2023-12-26 MED ORDER — OSELTAMIVIR PHOSPHATE 30 MG PO CAPS
30.0000 mg | ORAL_CAPSULE | Freq: Once | ORAL | Status: AC
  Administered 2023-12-26: 30 mg via ORAL
  Filled 2023-12-26: qty 1

## 2023-12-26 MED ORDER — ACETAMINOPHEN 325 MG PO TABS: 650.0000 mg | ORAL_TABLET | Freq: Four times a day (QID) | ORAL | Status: DC | PRN

## 2023-12-26 MED ORDER — HYDRALAZINE HCL 20 MG/ML IJ SOLN
10.0000 mg | Freq: Four times a day (QID) | INTRAMUSCULAR | Status: DC | PRN
  Administered 2023-12-30 – 2023-12-31 (×2): 10 mg via INTRAVENOUS

## 2023-12-26 MED ORDER — OSELTAMIVIR PHOSPHATE 30 MG PO CAPS
30.0000 mg | ORAL_CAPSULE | Freq: Two times a day (BID) | ORAL | Status: DC
  Administered 2023-12-27 – 2023-12-29 (×6): 30 mg via ORAL
  Filled 2023-12-26 (×7): qty 1

## 2023-12-26 MED ORDER — LACTATED RINGERS IV BOLUS
1000.0000 mL | Freq: Once | INTRAVENOUS | Status: AC
  Administered 2023-12-26: 1000 mL via INTRAVENOUS

## 2023-12-26 MED ORDER — SODIUM CHLORIDE 0.9 % IV SOLN
1.0000 g | INTRAVENOUS | Status: DC
  Administered 2023-12-26 – 2023-12-27 (×2): 1 g via INTRAVENOUS
  Filled 2023-12-26 (×2): qty 10

## 2023-12-26 MED ORDER — ALBUTEROL SULFATE (2.5 MG/3ML) 0.083% IN NEBU
2.5000 mg | INHALATION_SOLUTION | RESPIRATORY_TRACT | Status: DC | PRN
  Administered 2023-12-30: 2.5 mg via RESPIRATORY_TRACT
  Filled 2023-12-26: qty 3

## 2023-12-26 MED ORDER — HEPARIN SODIUM (PORCINE) 5000 UNIT/ML IJ SOLN
5000.0000 [IU] | Freq: Three times a day (TID) | INTRAMUSCULAR | Status: DC
  Administered 2023-12-26 – 2024-01-01 (×18): 5000 [IU] via SUBCUTANEOUS
  Filled 2023-12-26 (×13): qty 1

## 2023-12-26 NOTE — Final Progress Note (Signed)
 Spoke to med tech at [facility], [phone]. She stated legal guardians are [name] [phone] and Gwenn Loa Farr [phone]. Med tech stated they were aware of his admission to the hospital.

## 2023-12-26 NOTE — ED Triage Notes (Signed)
 Patient BIB [facility] from [place]. Called out for a fall unwitnessed. Has a cough and weakness chronic back pain. Temp 102.4 axillary, 115 pulse 150 systolic. 1 gram of rocephin . 93% room air 2 L oxygen placed and o2 bumped up to 96%.

## 2023-12-26 NOTE — ED Provider Notes (Signed)
 " [place] EMERGENCY DEPARTMENT AT [facility] Provider Note   CSN: [id number] Arrival date & time: 12/26/23  9086     Patient presents with: Fall and Weakness   Dylan Mahoney is a 68 y.o. male.   68 year old male with past medical history of hypertension and schizoaffective disorder presenting to the emergency department today after an unwitnessed fall and some generalized weakness.  The patient was at his facility when he apparently fell.  The patient is a difficult historian.  States that he has been feeling unwell with a cough over the past few days.  He states that he is feeling weak.  He is unsure if he hit his head but does not think that he did when he fell.  Denies any other complaints at this time.   Fall  Weakness Associated symptoms: cough        Prior to Admission medications  Medication Sig Start Date End Date Taking? Authorizing Provider  aspirin EC 81 MG tablet Take 81 mg by mouth daily.    [provider]  atenolol (TENORMIN) 25 MG tablet Take 12.5 mg by mouth daily.    [provider]  divalproex (DEPAKOTE) 500 MG DR tablet Take 1,000 mg by mouth at bedtime.     [provider]  famotidine (PEPCID) 20 MG tablet Take 20 mg by mouth 2 (two) times daily.    [provider]  finasteride (PROSCAR) 5 MG tablet Take 5 mg by mouth daily.    [provider]  levothyroxine (SYNTHROID, LEVOTHROID) 100 MCG tablet Take 100 mcg by mouth daily before breakfast.    [provider]  LORazepam (ATIVAN) 2 MG tablet Take 2 mg by mouth every 8 (eight) hours as needed for anxiety.    [provider]  OLANZapine (ZYPREXA) 15 MG tablet Take 15 mg by mouth at bedtime.    [provider]  omeprazole (PRILOSEC) 20 MG capsule Take 20 mg by mouth daily.    [provider]  ranitidine (ZANTAC) 300 MG capsule Take 300 mg by mouth every evening.    [provider]  tamsulosin (FLOMAX) 0.4 MG  CAPS capsule Take 0.4 mg by mouth.    [provider]    Allergies: Patient has no allergy information on record.    Review of Systems  Respiratory:  Positive for cough.   Neurological:  Positive for weakness.  All other systems reviewed and are negative.   Updated Vital Signs BP (!) 150/84   Pulse (!) 116   Temp 98 F (36.7 C) (Oral)   Resp (!) 29   Ht 5' 5 (1.651 m)   Wt 94.3 kg   SpO2 93%   BMI 34.61 kg/m   Physical Exam Vitals and nursing note reviewed.   Gen: Ill-appearing but nontoxic Eyes: PERRL, EOMI HEENT: no oropharyngeal swelling Neck: trachea midline, no cervical spine tenderness, no stepoffs or deformities Resp: Coarse breath sounds at bilateral lung bases Card: RRR, no murmurs, rubs, or gallops Abd: nontender, nondistended, no seatbelt sign Extremities: no calf tenderness, no edema MSK: no thoracic spinal tenderness, no lumbar spinal tenderness, no step-offs or deformities Vascular: 2+ radial pulses bilaterally, 2+ DP pulses bilaterally Neuro: Alert and oriented x 3, equal strength sensation throughout bilateral upper and lower extremities Skin: no rashes    (all labs ordered are listed, but only abnormal results are displayed) Labs Reviewed  RESP PANEL BY RT-PCR (RSV, FLU A&B, COVID)  [id number] - Abnormal; Notable for  the following components:      Result Value   Influenza A by PCR POSITIVE (*)    All other components within normal limits  COMPREHENSIVE METABOLIC PANEL WITH GFR - Abnormal; Notable for the following components:   Glucose, Bld 111 (*)    Creatinine, Ser 1.45 (*)    Calcium 8.8 (*)    AST 76 (*)    ALT 51 (*)    GFR, Estimated 53 (*)    All other components within normal limits  CBC - Abnormal; Notable for the following components:   Platelets 112 (*)    All other components within normal limits  CULTURE, BLOOD (ROUTINE X 2)  CULTURE, BLOOD (ROUTINE X 2)  URINALYSIS, ROUTINE W REFLEX MICROSCOPIC  LACTIC ACID, PLASMA   LACTIC ACID, PLASMA  CBG MONITORING, ED    EKG: EKG Interpretation Date/Time:  Thursday December 26 2023 10:12:36 EST Ventricular Rate:  111 PR Interval:    QRS Duration:  87 QT Interval:  310 QTC Calculation: 422 R Axis:   1  Text Interpretation: Sinus tachycardia Nonspecific ST abnormality Confirmed by Ula Barter [phone]) on 12/26/2023 10:26:42 AM  Radiology: DG Chest Portable 1 View Result Date: 12/26/2023 EXAM: 1 VIEW(S) XRAY OF THE CHEST 12/26/2023 01:06:00 PM COMPARISON: None available. CLINICAL HISTORY: sob FINDINGS: LUNGS AND PLEURA: Low lung volumes. Bilateral perihilar interstitial opacities with mild elevation of the right hemidiaphragm. No pleural effusion. No pneumothorax. HEART AND MEDIASTINUM: Borderline cardiomegaly. Aortic atherosclerosis. BONES AND SOFT TISSUES: Multilevel degenerative changes of the thoracic spine. No acute osseous abnormality. IMPRESSION: 1. Low lung volumes with bilateral perihilar interstitial opacities, which may be due to bronchovascular crowding, interstitial edema, or atypical/viral infection, in the correct clinical context. Electronically signed by: Rogelia Myers MD 12/26/2023 01:17 PM EST RP Workstation: [id number]   CT Head Wo Contrast Result Date: 12/26/2023 EXAM: CT HEAD WITHOUT CONTRAST 12/26/2023 11:56:14 AM TECHNIQUE: CT of the head was performed without the administration of intravenous contrast. Automated exposure control, iterative reconstruction, and/or weight based adjustment of the mA/kV was utilized to reduce the radiation dose to as low as reasonably achievable. COMPARISON: None available. CLINICAL HISTORY: 68 year old male with fever, cough, weakness, and unobserved fall. FINDINGS: BRAIN AND VENTRICLES: No acute hemorrhage. No evidence of acute infarct. No hydrocephalus. No extra-axial collection. No mass effect or midline shift. Mild calcified atherosclerosis at the skull base. Chronic lacunar infarct in the ventral left thalamus  (series 2 image 40). Generalized cerebral volume loss seems advanced for age. Questionable disproportionate atrophy also in the bilateral lower cerebellum (series 2 image 18), nonspecific. Maintained gray white differentiation otherwise. No suspicious intracranial vascular hyperdensity. ORBITS: No gaze deviation. SINUSES: Mild to moderate paranasal sinus mucosal thickening primarily affecting the ethmoids. Bubbly opacity in the right frontoethmoidal recess. No layering sinus fluid. Tympanic cavities and mastoids appear clear. SOFT TISSUES AND SKULL: No acute soft tissue abnormality. No skull fracture. IMPRESSION: 1. No acute intracranial abnormality. 2. Nonspecific advanced for age brain volume loss, especially the lower cerebellum. Chronic left thalamic lacunar infarct. 3. Acute paranasal sinus inflammation. Electronically signed by: Helayne Hurst MD 12/26/2023 12:11 PM EST RP Workstation: [id number]     Procedures   Medications Ordered in the ED  azithromycin  (ZITHROMAX ) 500 mg in sodium chloride  0.9 % 250 mL IVPB (500 mg Intravenous New Bag/Given 12/26/23 1105)  oseltamivir  (TAMIFLU ) capsule 30 mg (has no administration in time range)  lactated ringers  bolus 1,000 mL (1,000 mLs Intravenous Bolus 12/26/23 1044)  acetaminophen  (TYLENOL ) tablet 1,000 mg (  1,000 mg Oral Given 12/26/23 1046)  ipratropium-albuterol  (DUONEB) 0.5-2.5 (3) MG/3ML nebulizer solution 3 mL (3 mLs Nebulization Given 12/26/23 1048)                                    Medical Decision Making 68 year old male with past medical history of hypertension and schizoaffective disorder presenting to the emergency department today with cough and generalized weakness after a fall at his nursing facility.  The patient is tachycardic on arrival and febrile with medics.  The patient did receive Rocephin  already.  Will give him azithromycin  as well here.  Will obtain lactic acid and blood cultures as well as an RSV/COVID/flu swab.  Will obtain a  CT scan of his head to eval for traumatic injuries.  I will reevaluate for ultimate disposition.  The patient's labs are largely unremarkable.  His flu test is positive.  The patient does remain on 2 L nasal cannula for hypoxia and is still tachypneic.  He is given renally dosed Tamiflu .  Calls placed to hospitalist service for admission.  Amount and/or Complexity of Data Reviewed Labs: ordered. Radiology: ordered.  Risk OTC drugs. Prescription drug management. Decision regarding hospitalization.        Final diagnoses:  Influenza  Hypoxia    ED Discharge Orders     None          Ula Prentice SAUNDERS, MD 12/26/23 1405  "

## 2023-12-26 NOTE — H&P (Signed)
 " History and Physical    Patient: Dylan Mahoney FMW:[id number] DOB: 1955/12/26 DOA: 12/26/2023 DOS: the patient was seen and examined on 12/26/2023 PCP: [name], MD  Patient coming from: Home  Chief Complaint:  Chief Complaint  Patient presents with   Fall   Weakness   HPI: Dylan Mahoney is a 68 y.o. male with medical history significant of hypertension and schizoaffective disorder, presented to the emergency room after an unwitnessed fall and generalized weakness.  Apparently, he was at a rehab facility and fell.  Per report, he has been feeling weak for the past few days associated with some cough and shortness of breath.  In the ED, he was found to be hypoxic with a oxygen saturation in the 80s and 2 L oxygen was placed with oxygen saturation improving to mid 90s.  He was also found to have tachycardia and tachypnea. He tested positive for influenza A and received 30 mg of Tamiflu . CT of the head was unremarkable for any acute findings Chest x-ray was unremarkable as well.   Review of Systems: As mentioned in the history of present illness. All other systems reviewed and are negative. Past Medical History:  Diagnosis Date   Bipolar 1 disorder (HCC)    Cancer (HCC)    prostate   Cleft lip    Cleft lip    GERD (gastroesophageal reflux disease)    Hypertension    Hypothyroidism    Onychomycosis    Prostate mass    Renal insufficiency    Seizure (HCC)    Seizures (HCC)    Thrombocytopenia    Thyroid disease    Past Surgical History:  Procedure Laterality Date   COLONOSCOPY WITH PROPOFOL  N/A 01/14/2018   Procedure: COLONOSCOPY WITH PROPOFOL ;  Surgeon: Gaylyn Gladis PENNER, MD;  Location: [facility] ENDOSCOPY;  Service: Endoscopy;  Laterality: N/A;   Social History:  reports that he has quit smoking. His smoking use included cigarettes. He has never used smokeless tobacco. He reports that he does not drink alcohol and does not use drugs.  Allergies[1]  History  reviewed. No pertinent family history.  Prior to Admission medications  Medication Sig Start Date End Date Taking? Authorizing Provider  aspirin EC 81 MG tablet Take 81 mg by mouth daily.    [provider]  atenolol (TENORMIN) 25 MG tablet Take 12.5 mg by mouth daily.    [provider]  divalproex (DEPAKOTE) 500 MG DR tablet Take 1,000 mg by mouth at bedtime.     [provider]  famotidine (PEPCID) 20 MG tablet Take 20 mg by mouth 2 (two) times daily.    [provider]  finasteride (PROSCAR) 5 MG tablet Take 5 mg by mouth daily.    [provider]  levothyroxine (SYNTHROID, LEVOTHROID) 100 MCG tablet Take 100 mcg by mouth daily before breakfast.    [provider]  LORazepam (ATIVAN) 2 MG tablet Take 2 mg by mouth every 8 (eight) hours as needed for anxiety.    [provider]  OLANZapine (ZYPREXA) 15 MG tablet Take 15 mg by mouth at bedtime.    [provider]  omeprazole (PRILOSEC) 20 MG capsule Take 20 mg by mouth daily.    [provider]  ranitidine (ZANTAC) 300 MG capsule Take 300 mg by mouth every evening.    [provider]  tamsulosin (FLOMAX) 0.4 MG CAPS capsule Take 0.4 mg by mouth.    [provider]    Physical Exam:  Vitals:   12/26/23 0921 12/26/23 0933 12/26/23 1015 12/26/23 1020  BP:  (!) 141/87 (!) 150/84   Pulse:  (!) 115 (!) 117 (!) 116  Resp:  17 (!) 23 (!) 29  Temp:  98 F (36.7 C)    TempSrc:  Oral    SpO2:  93%  93%  Weight: 94.3 kg     Height: 5' 5 (1.651 m)      Constitutional: NAD, calm, comfortable Eyes: PERRL, lids and conjunctivae normal ENMT: Mucous membranes are moist. Posterior pharynx clear of any exudate or lesions.Normal dentition.  Neck: normal, supple, no masses, no thyromegaly Respiratory: clear to auscultation bilaterally, no wheezing, no crackles. Normal respiratory effort. No accessory muscle use.  Cardiovascular: Regular rate and rhythm,  no murmurs / rubs / gallops. No extremity edema. 2+ pedal pulses. No carotid bruits.  Abdomen: no tenderness, no masses palpated. No hepatosplenomegaly. Bowel sounds positive.  Musculoskeletal: no clubbing / cyanosis. No joint deformity upper and lower extremities. Good ROM, no contractures. Normal muscle tone.  Skin: no rashes, lesions, ulcers. No induration Neurologic: CN 2-12 grossly intact. Sensation intact, DTR normal. Strength 5/5 x all 4 extremities.   Data Reviewed:  There are no new results to review at this time.  Assessment and Plan:   Influenza A infection, POA: Tested positive for influenza A.  Continue with conservative management and droplet precautions.  Continue with Tamiflu  30 mg p.o. twice daily.  ARF with hypoxia,POA: secondary to above. Oxygen saturation in the 80s and he was subsequently placed on 2 L oxygen. Ordered intravenous ceftriaxone  and azithromycin  for possibility of superimposed bacterial infection.  Tachypnea and tachycardia/SIRS, POA: Likely secondary to influenza A infection.  Patient is afebrile and there is no evidence of leukocytosis. Continue with intravenous fluids as is intravenous antibiotics empirically and follow up vitals closely Follow-up blood cultures  Essential hypertension, POA: Ordered as needed intravenous hydralazine  every 6 as needed for systolic blood pressure greater than 160  Abnormal kidney function, POA: Creatinine 1.45.  No prior kidney function to compare with.  Continue intravenous fluids and follow BMP in the morning.  Physical deconditioning, POA: Secondary to influenza infection.  I have ordered PT/OT evaluations  Disposition: Back to [facility] facility.      Advance Care Planning:   Code Status: Not on file full   Consults: None  Family Communication: None at the bedside  Severity of Illness: The appropriate patient status for this patient is INPATIENT. Inpatient status is judged to be reasonable and necessary  in order to provide the required intensity of service to ensure the patient's safety. The patient's presenting symptoms, physical exam findings, and initial radiographic and laboratory data in the context of their chronic comorbidities is felt to place them at high risk for further clinical deterioration. Furthermore, it is not anticipated that the patient will be medically stable for discharge from the hospital within 2 midnights of admission.   * I certify that at the point of admission it is my clinical judgment that the patient will require inpatient hospital care spanning beyond 2 midnights from the point of admission due to high intensity of service, high risk for further deterioration and high frequency of surveillance required.*  Author: Deliliah Room, MD 12/26/2023 2:19 PM  For on call review [email or web address].      [1] Not on File  "

## 2023-12-26 NOTE — Plan of Care (Signed)

## 2023-12-26 NOTE — ED Notes (Signed)
 Angel from [employer]

## 2023-12-27 DIAGNOSIS — J101 Influenza due to other identified influenza virus with other respiratory manifestations: Secondary | ICD-10-CM | POA: Diagnosis not present

## 2023-12-27 LAB — BASIC METABOLIC PANEL WITH GFR
Anion gap: 7 (ref 5–15)
BUN: 22 mg/dL (ref 8–23)
CO2: 29 mmol/L (ref 22–32)
Calcium: 8.7 mg/dL — ABNORMAL LOW (ref 8.9–10.3)
Chloride: 104 mmol/L (ref 98–111)
Creatinine, Ser: 1.4 mg/dL — ABNORMAL HIGH (ref 0.61–1.24)
GFR, Estimated: 55 mL/min — ABNORMAL LOW
Glucose, Bld: 97 mg/dL (ref 70–99)
Potassium: 4.6 mmol/L (ref 3.5–5.1)
Sodium: 140 mmol/L (ref 135–145)

## 2023-12-27 LAB — CBC
HCT: 40.7 % (ref 39.0–52.0)
Hemoglobin: 12.8 g/dL — ABNORMAL LOW (ref 13.0–17.0)
MCH: 29.6 pg (ref 26.0–34.0)
MCHC: 31.4 g/dL (ref 30.0–36.0)
MCV: 94.2 fL (ref 80.0–100.0)
Platelets: 100 K/uL — ABNORMAL LOW (ref 150–400)
RBC: 4.32 MIL/uL (ref 4.22–5.81)
RDW: 15.2 % (ref 11.5–15.5)
WBC: 6.5 K/uL (ref 4.0–10.5)
nRBC: 0 % (ref 0.0–0.2)

## 2023-12-27 LAB — HIV ANTIBODY (ROUTINE TESTING W REFLEX): HIV Screen 4th Generation wRfx: NONREACTIVE

## 2023-12-27 NOTE — Plan of Care (Signed)
" °  Problem: Acute Rehab OT Goals (only OT should resolve) Goal: Pt. Will Perform Grooming Flowsheets (Taken 12/27/2023 1038) Pt Will Perform Grooming:  with supervision  sitting  standing Goal: Pt. Will Perform Upper Body Dressing Flowsheets (Taken 12/27/2023 1038) Pt Will Perform Upper Body Dressing:  with supervision  sitting Goal: Pt. Will Perform Lower Body Dressing Flowsheets (Taken 12/27/2023 1038) Pt Will Perform Lower Body Dressing:  with supervision  sitting/lateral leans  sit to/from stand Goal: Pt. Will Transfer To Toilet Flowsheets (Taken 12/27/2023 1038) Pt Will Transfer to Toilet:  with contact guard assist  ambulating  regular height toilet Goal: Pt. Will Perform Toileting-Clothing Manipulation Flowsheets (Taken 12/27/2023 1038) Pt Will Perform Toileting - Clothing Manipulation and hygiene:  with contact guard assist  sitting/lateral leans  sit to/from stand   "

## 2023-12-27 NOTE — TOC Initial Note (Signed)
 Transition of Care [facility]) - Initial/Assessment Note    Patient Details  Name: Dylan Mahoney MRN: [id number] Date of Birth: 1955/07/28  [facility] CM/SW Contact:    Dylan DELENA Bigness, LCSW Phone Number: 12/27/2023, 12:02 PM  Clinical Narrative:                 CSW spoke with pt's sister/legal guardian Dylan Mahoney. Dylan Mahoney shares that she and her sister, Dylan Mahoney are both pt's legal guardian.  Pt currently resides at [facility] and is typically able to ambulate with a RW. In the past pt has used wheelchair. Pt does not currently receive any HH services however, was receiving HHPT a few months ago.  Pt's sister is agreeable to plan for STR placement prior to pt returning to [facility]. Pt's sister does not have a facility preference at this time. Referrals have been faxed out and currently awaiting bed offers.  CSW spoke with [facility] to inform of disposition plan.   Expected Discharge Plan: Skilled Nursing Facility Barriers to Discharge: Continued Medical Work up   Patient Goals and CMS Choice Patient states their goals for this hospitalization and ongoing recovery are:: For pt to get rehab before returning to ALF CMS Medicare.gov Compare Post Acute Care list provided to:: Patient Represenative (must comment) Choice offered to / list presented to : Adult Children  ownership interest in [facility]provided to:: Adult Children    Expected Discharge Plan and Services In-house Referral: Clinical Social Work Discharge Planning Services: NA Post Acute Care Choice: Skilled Nursing Facility Living arrangements for the past 2 months: Skilled Nursing Facility                 DME Arranged: N/A DME Agency: NA                  Prior Living Arrangements/Services Living arrangements for the past 2 months: Skilled Nursing Facility Lives with:: Facility Resident Patient language and need for interpreter reviewed:: Yes Do you feel safe going back to  the place where you live?: Yes      Need for Family Participation in Patient Care: Yes (Comment) Care giver support system in place?: Yes (comment) Current home services: DME (RW, wheelchair) Criminal Activity/Legal Involvement Pertinent to Current Situation/Hospitalization: No - Comment as needed  Activities of Daily Living   ADL Screening (condition at time of admission) Independently performs ADLs?: No Does the patient have a NEW difficulty with bathing/dressing/toileting/self-feeding that is expected to last >3 days?: No Does the patient have a NEW difficulty with getting in/out of bed, walking, or climbing stairs that is expected to last >3 days?: No Does the patient have a NEW difficulty with communication that is expected to last >3 days?: No Is the patient deaf or have difficulty hearing?: Yes Does the patient have difficulty seeing, even when wearing glasses/contacts?: No Does the patient have difficulty concentrating, remembering, or making decisions?: Yes  Permission Sought/Granted Permission sought to share information with : Facility Medical Sales Representative, Family Supports Permission granted to share information with : Yes, Verbal Permission Granted              Emotional Assessment Appearance:: Appears stated age Attitude/Demeanor/Rapport: Unable to Assess Affect (typically observed): Unable to Assess Orientation: : Oriented to Self Alcohol / Substance Use: Not Applicable Psych Involvement: No (comment)  Admission diagnosis:  Influenza A [J10.1] Hypoxia [R09.02] Influenza [J11.1] Patient Active Problem List   Diagnosis Date Noted   Influenza A  12/26/2023   Thrombocytopenia 04/18/2017   PCP:  [name], MD Pharmacy:  No Pharmacies Listed    Social Drivers of Health (SDOH) Social History: SDOH Screenings   Food Insecurity: Patient Unable To Answer (12/26/2023)  Housing: Unknown (12/26/2023)  Transportation Needs: Patient Unable To Answer  (12/26/2023)  Utilities: Patient Unable To Answer (12/26/2023)  Financial Resource Strain: Low Risk  (04/18/2022)   Received from [facility]  Social Connections: Patient Unable To Answer (12/26/2023)  Tobacco Use: Medium Risk (12/26/2023)   SDOH Interventions:     Readmission Risk Interventions    12/27/2023   12:00 PM  Readmission Risk Prevention Plan  Post Dischage Appt Complete  Medication Screening Complete

## 2023-12-27 NOTE — Progress Notes (Signed)
 " PROGRESS NOTE    Dylan Mahoney  FMW:[id number] DOB: [birth year] DOA: 12/26/2023 PCP: Dylan Benita Area, MD   Brief Narrative:   68 y.o. male with medical history significant of hypertension and schizoaffective disorder, presented to the emergency room after an unwitnessed fall and generalized weakness.    Admitted for management of acute respiratory failure with hypoxia in the setting of influenza A infection. PT consulted because of generalized weakness/physical deconditioning. He is from [facility] [place] [facility] facility.  Assessment & Plan:  Principal Problem:   Influenza A   Influenza A infection, POA: Tested positive for influenza A.  Continue with conservative management and droplet precautions.  Continue with Tamiflu  30 mg p.o. twice daily.   ARF with hypoxia,POA: secondary to above. Oxygen saturation in the 80s and he was subsequently placed on 2 L oxygen. Ordered intravenous ceftriaxone  and azithromycin  for possibility of superimposed bacterial infection.   Tachypnea and tachycardia/SIRS, POA: Improved.  Likely secondary to influenza A infection.  Patient is afebrile and there is no evidence of leukocytosis. Continue with intravenous fluids as is intravenous antibiotics empirically and follow up vitals closely Follow-up blood cultures-no growth to date   Essential hypertension, POA: Ordered as needed intravenous hydralazine  every 6 hours as needed for systolic blood pressure greater than 160   Abnormal kidney function, POA: Creatinine 1.45 on admission.  No prior kidney function to compare with.  Continue intravenous fluids and follow BMP in the morning.   Physical deconditioning, POA: Secondary to influenza infection.  I have ordered PT/OT evaluations   Disposition: Back to [facility] facility, likely in the next 24 to 48 hours.   DVT prophylaxis: heparin  injection 5,000 Units Start: 12/26/23 2200     Code Status: Full Code Family Communication: None at the  bedside Status is: Inpatient Remains inpatient appropriate because: Influenza A, acute respiratory failure with hypoxia    Subjective:  He is currently on nasal oxygen at 2 L.  Denies any active complaints.  He was asking that when he can be discharged.  I spoke to him about getting physical therapy on board.   Examination:  General exam: Appears calm and comfortable, nasal oxygen cannula in place at 2 L Respiratory system: Clear to auscultation. Respiratory effort normal. Cardiovascular system: S1 & S2 heard, RRR. No JVD, murmurs, rubs, gallops or clicks. No pedal edema. Gastrointestinal system: Abdomen is nondistended, soft and nontender. No organomegaly or masses felt. Normal bowel sounds heard. Central nervous system: Alert and oriented. No focal neurological deficits. Extremities: Symmetric 5 x 5 power. Skin: No rashes, lesions or ulcers    Diet Orders (From admission, onward)     Start     Ordered   12/26/23 1425  DIET SOFT Room service appropriate? Yes; Fluid consistency: Thin  Diet effective now       Question Answer Comment  Room service appropriate? Yes   Fluid consistency: Thin      12/26/23 1425            Objective: Vitals:   12/26/23 1400 12/26/23 1512 12/26/23 2301 12/27/23 0336  BP: 131/77 (!) 159/90 (!) 150/69 139/75  Pulse: 85 91 (!) 101 92  Resp: 17     Temp:  98.5 F (36.9 C) 98.1 F (36.7 C) 98.4 F (36.9 C)  TempSrc:  Oral Oral Oral  SpO2: 97% 98% 93% 96%  Weight:      Height:        Intake/Output Summary (Last 24 hours) at 12/27/2023 [phone]  Last data filed at 12/27/2023 9357 Gross per 24 hour  Intake 411.51 ml  Output 1700 ml  Net -1288.49 ml   Filed Weights   12/26/23 0921  Weight: 94.3 kg    Scheduled Meds:  guaiFENesin   600 mg Oral BID   heparin   5,000 Units Subcutaneous Q8H   oseltamivir   30 mg Oral BID   Continuous Infusions:  azithromycin  500 mg (12/26/23 1105)   cefTRIAXone  (ROCEPHIN )  IV 1 g (12/26/23 1610)     Nutritional status     Body mass index is 34.61 kg/m.  Data Reviewed:   CBC: Recent Labs  Lab 12/26/23 0934 12/27/23 0510  WBC 8.2 6.5  HGB 13.3 12.8*  HCT 41.8 40.7  MCV 94.6 94.2  PLT 112* 100*   Basic Metabolic Panel: Recent Labs  Lab 12/26/23 0934 12/27/23 0510  NA 139 140  K 4.5 4.6  CL 102 104  CO2 23 29  GLUCOSE 111* 97  BUN 21 22  CREATININE 1.45* 1.40*  CALCIUM 8.8* 8.7*   GFR: Estimated Creatinine Clearance: 54.8 mL/min (A) (by C-G formula based on SCr of 1.4 mg/dL (H)). Liver Function Tests: Recent Labs  Lab 12/26/23 0934  AST 76*  ALT 51*  ALKPHOS 57  BILITOT 0.4  PROT 7.4  ALBUMIN 3.9   No results for input(s): LIPASE, AMYLASE in the last 168 hours. No results for input(s): AMMONIA in the last 168 hours. Coagulation Profile: No results for input(s): INR, PROTIME in the last 168 hours. Cardiac Enzymes: No results for input(s): CKTOTAL, CKMB, CKMBINDEX, TROPONINI in the last 168 hours. BNP (last 3 results) No results for input(s): PROBNP in the last 8760 hours. HbA1C: No results for input(s): HGBA1C in the last 72 hours. CBG: No results for input(s): GLUCAP in the last 168 hours. Lipid Profile: No results for input(s): CHOL, HDL, LDLCALC, TRIG, CHOLHDL, LDLDIRECT in the last 72 hours. Thyroid Function Tests: No results for input(s): TSH, T4TOTAL, FREET4, T3FREE, THYROIDAB in the last 72 hours. Anemia Panel: No results for input(s): VITAMINB12, FOLATE, FERRITIN, TIBC, IRON, RETICCTPCT in the last 72 hours. Sepsis Labs: Recent Labs  Lab 12/26/23 1042 12/26/23 1336  LATICACIDVEN 1.7 1.1    Recent Results (from the past 240 hours)  Resp panel by RT-PCR (RSV, Flu A&B, Covid) Anterior Nasal Swab     Status: Abnormal   Collection Time: 12/26/23  9:34 AM   Specimen: Anterior Nasal Swab  Result Value Ref Range Status   SARS Coronavirus 2 by RT PCR NEGATIVE NEGATIVE Final    Influenza A by PCR POSITIVE (A) NEGATIVE Final   Influenza B by PCR NEGATIVE NEGATIVE Final   Resp Syncytial Virus by PCR NEGATIVE NEGATIVE Final    Comment: Performed at [facility], [address], [place], [place] [address]  Blood culture (routine x 2)     Status: None (Preliminary result)   Collection Time: 12/26/23 10:42 AM   Specimen: Left Antecubital; Blood  Result Value Ref Range Status   Specimen Description   Final    LEFT ANTECUBITAL BOTTLES DRAWN AEROBIC AND ANAEROBIC   Special Requests Blood Culture adequate volume  Final   Culture   Final    NO GROWTH < 24 HOURS Performed at [facility], [address], [place], [place] [address]    Report Status PENDING  Incomplete  Blood culture (routine x 2)     Status: None (Preliminary result)   Collection Time: 12/26/23 10:42 AM   Specimen: BLOOD LEFT HAND  Result Value Ref Range Status   Specimen Description   Final    BLOOD LEFT HAND BOTTLES DRAWN AEROBIC AND ANAEROBIC   Special Requests Blood Culture adequate volume  Final   Culture   Final    NO GROWTH < 24 HOURS Performed at [facility], [address], [place], [place] [address]    Report Status PENDING  Incomplete         Radiology Studies: DG Chest Portable 1 View Result Date: 12/26/2023 EXAM: 1 VIEW(S) XRAY OF THE CHEST 12/26/2023 01:06:00 PM COMPARISON: None available. CLINICAL HISTORY: sob FINDINGS: LUNGS AND PLEURA: Low lung volumes. Bilateral perihilar interstitial opacities with mild elevation of the right hemidiaphragm. No pleural effusion. No pneumothorax. HEART AND MEDIASTINUM: Borderline cardiomegaly. Aortic atherosclerosis. BONES AND SOFT TISSUES: Multilevel degenerative changes of the thoracic spine. No acute osseous abnormality. IMPRESSION: 1. Low lung volumes with bilateral perihilar interstitial opacities, which may be due to bronchovascular crowding, interstitial edema, or atypical/viral infection, in the correct clinical context. Electronically signed  by: Rogelia Myers MD 12/26/2023 01:17 PM EST RP Workstation: [id number]   CT Head Wo Contrast Result Date: 12/26/2023 EXAM: CT HEAD WITHOUT CONTRAST 12/26/2023 11:56:14 AM TECHNIQUE: CT of the head was performed without the administration of intravenous contrast. Automated exposure control, iterative reconstruction, and/or weight based adjustment of the mA/kV was utilized to reduce the radiation dose to as low as reasonably achievable. COMPARISON: None available. CLINICAL HISTORY: 68 year old male with fever, cough, weakness, and unobserved fall. FINDINGS: BRAIN AND VENTRICLES: No acute hemorrhage. No evidence of acute infarct. No hydrocephalus. No extra-axial collection. No mass effect or midline shift. Mild calcified atherosclerosis at the skull base. Chronic lacunar infarct in the ventral left thalamus (series 2 image 40). Generalized cerebral volume loss seems advanced for age. Questionable disproportionate atrophy also in the bilateral lower cerebellum (series 2 image 18), nonspecific. Maintained gray white differentiation otherwise. No suspicious intracranial vascular hyperdensity. ORBITS: No gaze deviation. SINUSES: Mild to moderate paranasal sinus mucosal thickening primarily affecting the ethmoids. Bubbly opacity in the right frontoethmoidal recess. No layering sinus fluid. Tympanic cavities and mastoids appear clear. SOFT TISSUES AND SKULL: No acute soft tissue abnormality. No skull fracture. IMPRESSION: 1. No acute intracranial abnormality. 2. Nonspecific advanced for age brain volume loss, especially the lower cerebellum. Chronic left thalamic lacunar infarct. 3. Acute paranasal sinus inflammation. Electronically signed by: Helayne Hurst MD 12/26/2023 12:11 PM EST RP Workstation: [id number]           LOS: 1 day   Time spent= 35 mins    Deliliah Room, MD [facility]  If 7PM-7AM, please contact night-coverage  12/27/2023, 9:24 AM  "

## 2023-12-27 NOTE — [place] FL2 (Signed)
 " [place]  MEDICAID FL2 LEVEL OF CARE FORM     IDENTIFICATION  Patient Name: Dylan Mahoney Birthdate: 1955/03/01 Sex: male Admission Date (Current Location): 12/26/2023  [facility] and [place] Number:  [employer] and Address:  [facility],  [address], [place] [address]      Provider Number: [id number]  Attending Physician Name and Address:  Dino Antu, MD  Relative Name and Phone Number:  [name] [phone]    Current Level of Care: Hospital Recommended Level of Care: Skilled Nursing Facility Prior Approval Number:    Date Approved/Denied:   PASRR Number: [id number]  Discharge Plan: SNF    Current Diagnoses: Patient Active Problem List   Diagnosis Date Noted   Influenza A 12/26/2023   Thrombocytopenia 04/18/2017    Orientation RESPIRATION BLADDER Height & Weight     Self  O2 (2L) External catheter Weight: 208 lb (94.3 kg) Height:  5' 5 (165.1 cm)  BEHAVIORAL SYMPTOMS/MOOD NEUROLOGICAL BOWEL NUTRITION STATUS      Continent Diet (See DC summary)  AMBULATORY STATUS COMMUNICATION OF NEEDS Skin   Extensive Assist Verbally Normal                       Personal Care Assistance Level of Assistance  Bathing, Feeding, Dressing Bathing Assistance: Maximum assistance Feeding assistance: Limited assistance Dressing Assistance: Maximum assistance     Functional Limitations Info  Sight, Hearing, Speech Sight Info: Impaired Hearing Info: Impaired Speech Info: Impaired    SPECIAL CARE FACTORS FREQUENCY  PT (By licensed PT), OT (By licensed OT)     PT Frequency: 5x/wk OT Frequency: 5x/wk            Contractures Contractures Info: Not present    Additional Factors Info  Code Status, Allergies Code Status Info: FULL Allergies Info: No known allergies           Current Medications (12/27/2023):  This is the current hospital active medication list Current Facility-Administered Medications  Medication Dose  Route Frequency Provider Last Rate Last Admin   acetaminophen  (TYLENOL ) tablet 650 mg  650 mg Oral Q6H PRN Rashid, Farhan, MD       Or   acetaminophen  (TYLENOL ) suppository 650 mg  650 mg Rectal Q6H PRN Rashid, Farhan, MD       albuterol  (PROVENTIL ) (2.5 MG/3ML) 0.083% nebulizer solution 2.5 mg  2.5 mg Nebulization Q2H PRN Rashid, Farhan, MD       azithromycin  (ZITHROMAX ) 500 mg in sodium chloride  0.9 % 250 mL IVPB  500 mg Intravenous Q24H Ula Prentice SAUNDERS, MD 250 mL/hr at 12/27/23 1000 500 mg at 12/27/23 1000   bisacodyl  (DULCOLAX) EC tablet 5 mg  5 mg Oral Daily PRN Rashid, Farhan, MD       cefTRIAXone  (ROCEPHIN ) 1 g in sodium chloride  0.9 % 100 mL IVPB  1 g Intravenous Q24H Rashid, Farhan, MD 200 mL/hr at 12/26/23 1610 1 g at 12/26/23 1610   guaiFENesin  (MUCINEX ) 12 hr tablet 600 mg  600 mg Oral BID Rashid, Farhan, MD   600 mg at 12/27/23 9176   heparin  injection 5,000 Units  5,000 Units Subcutaneous Q8H Rashid, Farhan, MD   5,000 Units at 12/27/23 9451   hydrALAZINE  (APRESOLINE ) injection 10 mg  10 mg Intravenous Q6H PRN Rashid, Farhan, MD       ondansetron  (ZOFRAN ) tablet 4 mg  4 mg Oral Q6H PRN Rashid, Farhan, MD       Or   ondansetron  (  ZOFRAN ) injection 4 mg  4 mg Intravenous Q6H PRN Rashid, Farhan, MD       oseltamivir  (TAMIFLU ) capsule 30 mg  30 mg Oral BID Rashid, Farhan, MD   30 mg at 12/27/23 9176   senna-docusate (Senokot-S) tablet 1 tablet  1 tablet Oral QHS PRN Rashid, Farhan, MD         Discharge Medications: Please see discharge summary for a list of discharge medications.  Relevant Imaging Results:  Relevant Lab Results:   Additional Information SSN: [id number]  Hoy DELENA Bigness, LCSW     "

## 2023-12-27 NOTE — Progress Notes (Signed)
 Mobility Specialist Progress Note:    12/27/23 1510  Mobility  Activity Pivoted/transferred to/from [facility];Ambulated with assistance  Level of Assistance Moderate assist, patient does 50-74%  Assistive Device Front wheel walker  Distance Ambulated (ft) 6 ft  Range of Motion/Exercises Active;All extremities  Activity Response Tolerated well  Mobility Referral Yes  Mobility visit 1 Mobility  Mobility Specialist Start Time (ACUTE ONLY) 1510  Mobility Specialist Stop Time (ACUTE ONLY) 1530  Mobility Specialist Time Calculation (min) (ACUTE ONLY) 20 min   Pt received in chair, requesting assistance to [facility]. Required ModA to stand and ambulate with RW. Tolerated well, slow labored movements. Returned to chair, CHARITY FUNDRAISER in room. All needs met.  Trea Carnegie Mobility Specialist Please contact via Special Educational Needs Teacher or  Rehab office at [phone]

## 2023-12-27 NOTE — Progress Notes (Signed)
 2nd time this shift pt has removed oxygen. Delayed mentality noted unable to notify staff. Tele on and continuous pox connected. 96% on 2 L/

## 2023-12-27 NOTE — Plan of Care (Signed)
" °  Problem: Acute Rehab PT Goals(only PT should resolve) Goal: Pt Will Go Supine/Side To Sit Outcome: Progressing Flowsheets (Taken 12/27/2023 1233) Pt will go Supine/Side to Sit:  with minimal assist  with moderate assist Goal: Patient Will Transfer Sit To/From Stand Outcome: Progressing Flowsheets (Taken 12/27/2023 1233) Patient will transfer sit to/from stand:  with minimal assist  with moderate assist Goal: Pt Will Transfer Bed To Chair/Chair To Bed Outcome: Progressing Flowsheets (Taken 12/27/2023 1233) Pt will Transfer Bed to Chair/Chair to Bed: with min assist Goal: Pt Will Ambulate Outcome: Progressing Flowsheets (Taken 12/27/2023 1233) Pt will Ambulate:  25 feet  with minimal assist  with moderate assist  with rolling walker   12:33 PM, 12/27/2023 Lynwood Music, MPT Physical Therapist with [facility] [phone] office [phone] mobile phone  "

## 2023-12-27 NOTE — Evaluation (Signed)
 Occupational Therapy Evaluation Patient Details Name: Dylan Mahoney MRN: [id number] DOB: 1955-12-06 Today's Date: 12/27/2023   History of Present Illness   Dylan Mahoney is a 68 y.o. male with medical history significant of hypertension and schizoaffective disorder, presented to the emergency room after an unwitnessed fall and generalized weakness.  Apparently, he was at a rehab facility and fell.  Per report, he has been feeling weak for the past few days associated with some cough and shortness of breath.     Clinical Impressions Pt agreeable to OT/PT co-evaluation today. Pt with hx of cognitive impairments, resides at ALF. Pt requires assistance with ADLs at baseline, suspect current illness has resulted in needing higher level of assistance. Demonstrating increased weakness, balance deficits, and decreased activity tolerance during evaluation requiring significant assistance for safety during task completion. Recommend continued skilled acute OT services to improve safety and independence in ADLs. Pt on room air throughout session maintaining SpO2 in low 90s.      If plan is discharge home, recommend the following:   A lot of help with walking and/or transfers;A lot of help with bathing/dressing/bathroom;Assistance with cooking/housework;Direct supervision/assist for medications management;Assist for transportation;Help with stairs or ramp for entrance;Supervision due to cognitive status     Functional Status Assessment   Patient has had a recent decline in their functional status and demonstrates the ability to make significant improvements in function in a reasonable and predictable amount of time.     Equipment Recommendations   None recommended by OT      Precautions/Restrictions   Precautions Precautions: Fall Restrictions Weight Bearing Restrictions Per Provider Order: No     Mobility Bed Mobility               General bed mobility comments: Defer to  PT note    Transfers                   General transfer comment: Defer to PT note          ADL either performed or assessed with clinical judgement   ADL Overall ADL's : Needs assistance/impaired Eating/Feeding: Set up;Sitting   Grooming: Set up;Sitting Grooming Details (indicate cue type and reason): Unable to stand for tasks due to balance deficits         Upper Body Dressing : Moderate assistance;Sitting Upper Body Dressing Details (indicate cue type and reason): Changed gown, mod assist to manage lines/cords and button sleeves of gown Lower Body Dressing: Moderate assistance   Toilet Transfer: Minimal assistance;Stand-pivot;Rolling walker (2 wheels) Toilet Transfer Details (indicate cue type and reason): simulated with bed to chair transfer         Functional mobility during ADLs: Moderate assistance;Rolling walker (2 wheels) General ADL Comments: Pt from ALF-suspect assistance for ADL completion at baseline, however balance significantly impaired with functional mobility tasks during evaluation                  Pertinent Vitals/Pain Pain Assessment Pain Assessment: Faces Faces Pain Scale: No hurt     Extremity/Trunk Assessment Upper Extremity Assessment Upper Extremity Assessment: Generalized weakness   Lower Extremity Assessment Lower Extremity Assessment: Defer to PT evaluation   Cervical / Trunk Assessment Cervical / Trunk Assessment: Normal   Communication Communication Communication: Impaired Factors Affecting Communication: Reduced clarity of speech   Cognition Arousal: Alert Behavior During Therapy: WFL for tasks assessed/performed Cognition: History of cognitive impairments  Following commands: Intact       Cueing  General Comments   Cueing Techniques: Verbal cues;Gestural cues              Home Living Family/patient expects to be discharged to:: Skilled nursing facility                                  Additional Comments: Currently resides at [facility]      Prior Functioning/Environment Prior Level of Function : Needs assist;Patient poor historian/Family not available             Mobility Comments: Reports uses RW to go to dining room ADLs Comments: Reports ALF staff help him, except he takes his own shower    OT Problem List: Decreased strength;Decreased activity tolerance;Impaired balance (sitting and/or standing);Decreased cognition;Decreased safety awareness;Cardiopulmonary status limiting activity   OT Treatment/Interventions: Self-care/ADL training;Therapeutic exercise;Neuromuscular education;DME and/or AE instruction;Therapeutic activities;Patient/family education      OT Goals(Current goals can be found in the care plan section)   Acute Rehab OT Goals Patient Stated Goal: None stated OT Goal Formulation: Patient unable to participate in goal setting Time For Goal Achievement: 01/10/24 Potential to Achieve Goals: Good   OT Frequency:  Min 2X/week    Co-evaluation PT/OT/SLP Co-Evaluation/Treatment: Yes Reason for Co-Treatment: Complexity of the patient's impairments (multi-system involvement)   OT goals addressed during session: ADL's and self-care      AM-PAC OT 6 Clicks Daily Activity     Outcome Measure Help from another person eating meals?: A Little Help from another person taking care of personal grooming?: A Little Help from another person toileting, which includes using toliet, bedpan, or urinal?: A Lot Help from another person bathing (including washing, rinsing, drying)?: A Lot Help from another person to put on and taking off regular upper body clothing?: A Little Help from another person to put on and taking off regular lower body clothing?: A Lot 6 Click Score: 15   End of Session Equipment Utilized During Treatment: Rolling walker (2 wheels);Oxygen  Activity Tolerance: Patient tolerated  treatment well Patient left: in chair;with call bell/phone within reach;with chair alarm set  OT Visit Diagnosis: Muscle weakness (generalized) (M62.81)                Time: 9168-9143 OT Time Calculation (min): 25 min Charges:  OT General Charges $OT Visit: 1 Visit OT Evaluation $OT Eval Low Complexity: 1 Low  Sonny Cory, OTR/L  [phone] 12/27/2023, 10:34 AM

## 2023-12-27 NOTE — Plan of Care (Signed)

## 2023-12-27 NOTE — Evaluation (Signed)
 Physical Therapy Evaluation Patient Details Name: Dylan Mahoney MRN: [id number] DOB: 08/27/1955 Today's Date: 12/27/2023  History of Present Illness  Dylan Mahoney is a 68 y.o. male with medical history significant of hypertension and schizoaffective disorder, presented to the emergency room after an unwitnessed fall and generalized weakness.  Apparently, he was at a rehab facility and fell.  Per report, he has been feeling weak for the past few days associated with some cough and shortness of breath.   Clinical Impression  Patient demonstrates slow labored movement for sitting up at bedside requiring increased time for scooting to EOB, very unsteady on feet due to BLE weakness and limited to a few steps at bedside before having to sit due to fatigue and c/o weakness. Patient tolerated sitting up in chair after therapy. Patient will benefit from continued skilled physical therapy in hospital and recommended venue below to increase strength, balance, endurance for safe ADLs and gait.           If plan is discharge home, recommend the following: A lot of help with bathing/dressing/bathroom;A lot of help with walking and/or transfers;Help with stairs or ramp for entrance;Assist for transportation;Assistance with cooking/housework   Can travel by private vehicle   No    Equipment Recommendations None recommended by PT  Recommendations for Other Services       Functional Status Assessment Patient has had a recent decline in their functional status and demonstrates the ability to make significant improvements in function in a reasonable and predictable amount of time.     Precautions / Restrictions Precautions Precautions: Fall Recall of Precautions/Restrictions: Impaired Restrictions Weight Bearing Restrictions Per Provider Order: No      Mobility  Bed Mobility Overal bed mobility: Needs Assistance Bed Mobility: Supine to Sit     Supine to sit: Mod assist, Max assist      General bed mobility comments: increased time, labored movement    Transfers Overall transfer level: Needs assistance Equipment used: Rolling walker (2 wheels) Transfers: Sit to/from Stand, Bed to chair/wheelchair/BSC Sit to Stand: Min assist, Mod assist   Step pivot transfers: Mod assist       General transfer comment: unsteady labored movement, poor standing balance due to BLE weakness    Ambulation/Gait Ambulation/Gait assistance: Mod assist Gait Distance (Feet): 5 Feet Assistive device: Rolling walker (2 wheels) Gait Pattern/deviations: Decreased step length - left, Decreased stance time - right, Decreased stride length Gait velocity: slow     General Gait Details: limited to a few steps forward/backwards at bedside before having to sit due to c/o weakness and fatigue  Stairs            Wheelchair Mobility     Tilt Bed    Modified Rankin (Stroke Patients Only)       Balance Overall balance assessment: Needs assistance Sitting-balance support: Feet supported, No upper extremity supported Sitting balance-Leahy Scale: Fair Sitting balance - Comments: seated at EOB   Standing balance support: During functional activity, Reliant on assistive device for balance, Bilateral upper extremity supported Standing balance-Leahy Scale: Poor Standing balance comment: fair/poor using RW                             Pertinent Vitals/Pain Pain Assessment Pain Assessment: Faces Faces Pain Scale: No hurt    Home Living Family/patient expects to be discharged to:: Assisted living  Home Equipment: Agricultural [employer]ant (2 wheels);Wheelchair - manual Additional Comments: Currently resides at [facility]    Prior Function Prior Level of Function : Needs assist       Physical Assist : Mobility (physical);ADLs (physical) Mobility (physical): Bed mobility;Transfers;Stairs;Gait   Mobility Comments: Supervised ambulation using RW for  household distances ADLs Comments: Reports ALF staff help him, except he takes his own shower     Extremity/Trunk Assessment   Upper Extremity Assessment Upper Extremity Assessment: Defer to OT evaluation    Lower Extremity Assessment Lower Extremity Assessment: Generalized weakness    Cervical / Trunk Assessment Cervical / Trunk Assessment: Normal  Communication   Communication Communication: Impaired Factors Affecting Communication: Reduced clarity of speech    Cognition Arousal: Alert Behavior During Therapy: WFL for tasks assessed/performed                             Following commands: Intact       Cueing Cueing Techniques: Verbal cues, Gestural cues     General Comments      Exercises     Assessment/Plan    PT Assessment Patient needs continued PT services  PT Problem List Decreased strength;Decreased activity tolerance;Decreased balance;Decreased mobility       PT Treatment Interventions DME instruction;Gait training;Stair training;Functional mobility training;Therapeutic activities;Therapeutic exercise;Balance training;Patient/family education    PT Goals (Current goals can be found in the Care Plan section)  Acute Rehab PT Goals Patient Stated Goal: return home after rehab PT Goal Formulation: With patient/family Time For Goal Achievement: 01/10/24 Potential to Achieve Goals: Good    Frequency Min 3X/week     Co-evaluation PT/OT/SLP Co-Evaluation/Treatment: Yes Reason for Co-Treatment: Complexity of the patient's impairments (multi-system involvement);To address functional/ADL transfers PT goals addressed during session: Mobility/safety with mobility;Balance;Proper use of DME OT goals addressed during session: ADL's and self-care       AM-PAC PT 6 Clicks Mobility  Outcome Measure Help needed turning from your back to your side while in a flat bed without using bedrails?: A Lot Help needed moving from lying on your back to  sitting on the side of a flat bed without using bedrails?: A Lot Help needed moving to and from a bed to a chair (including a wheelchair)?: A Lot Help needed standing up from a chair using your arms (e.g., wheelchair or bedside chair)?: A Lot Help needed to walk in hospital room?: A Lot Help needed climbing 3-5 steps with a railing? : A Lot 6 Click Score: 12    End of Session Equipment Utilized During Treatment: Oxygen Activity Tolerance: Patient tolerated treatment well;Patient limited by fatigue Patient left: in chair;with call bell/phone within reach;with chair alarm set Nurse Communication: Mobility status PT Visit Diagnosis: Unsteadiness on feet (R26.81);Other abnormalities of gait and mobility (R26.89);Muscle weakness (generalized) (M62.81)    Time: 9170-9144 PT Time Calculation (min) (ACUTE ONLY): 26 min   Charges:   PT Evaluation $PT Eval Moderate Complexity: 1 Mod PT Treatments $Therapeutic Activity: 23-37 mins PT General Charges $$ ACUTE PT VISIT: 1 Visit         12:31 PM, 12/27/2023 Lynwood Music, MPT Physical Therapist with [facility] 336 [phone] office [phone] mobile phone

## 2023-12-28 DIAGNOSIS — J101 Influenza due to other identified influenza virus with other respiratory manifestations: Secondary | ICD-10-CM | POA: Diagnosis not present

## 2023-12-28 NOTE — Plan of Care (Signed)

## 2023-12-28 NOTE — Progress Notes (Addendum)
 " PROGRESS NOTE    Dylan Mahoney  FMW:[id number] DOB: [birth year] DOA: 12/26/2023 PCP: [name], MD   Brief Narrative:   68 y.o. male with medical history significant of hypertension and schizoaffective disorder, presented to the emergency room after an unwitnessed fall and generalized weakness.    Admitted for management of acute respiratory failure with hypoxia in the setting of influenza A infection. PT consulted because of generalized weakness/physical deconditioning. He is from [facility] [place] [facility].  Pending placement to SNF. Medically ready.  Assessment & Plan:  Principal Problem:   Influenza A   Influenza A infection, POA: Tested positive for influenza A.  Continue with conservative management and droplet precautions.  Continue with Tamiflu  30 mg p.o. twice daily.   ARF with hypoxia,POA: secondary to above. Oxygen saturation was in the 80s and he was subsequently placed on 2 L oxygen. Not needing any supplemental oxygen anymore.   Ordered intravenous ceftriaxone  and azithromycin  for possibility of superimposed bacterial infection. Dced on 12/27 as no evidence of pneumonia found and patient is afebrile and has no leukocytosis.   Tachypnea and tachycardia/SIRS, POA: Improved.  Likely secondary to influenza A infection.  Patient is afebrile and there is no evidence of leukocytosis. Continue with intravenous fluids as is intravenous antibiotics empirically and follow up vitals closely Follow-up blood cultures-no growth to date   Essential hypertension, POA: Ordered as needed intravenous hydralazine  every 6 hours as needed for systolic blood pressure greater than 160   Abnormal kidney function, POA: Creatinine 1.45 on admission.  No prior kidney function to compare with.  Continue intravenous fluids and follow BMP in the morning.   Physical deconditioning, POA: Secondary to influenza infection.  I have ordered PT/OT evaluations   Disposition: He is  from [facility] facility. Needs SNF on discharge.   DVT prophylaxis: heparin  injection 5,000 Units Start: 12/26/23 2200     Code Status: Full Code Family Communication: None at the bedside Status is: Inpatient Remains inpatient appropriate because: Influenza A, acute respiratory failure with hypoxia    Subjective:  No acute events overnight. Not needing any supplemental oxygen anymore. Pending placement to SNF.   Examination:  General exam: Appears calm and comfortable Respiratory system: Clear to auscultation. Respiratory effort normal. Cardiovascular system: S1 & S2 heard, RRR. No JVD, murmurs, rubs, gallops or clicks. No pedal edema. Gastrointestinal system: Abdomen is nondistended, soft and nontender. No organomegaly or masses felt. Normal bowel sounds heard. Central nervous system: Alert and oriented x 1. No focal neurological deficits. Extremities: Symmetric 5 x 5 power. Skin: No rashes, lesions or ulcers    Diet Orders (From admission, onward)     Start     Ordered   12/26/23 1425  DIET SOFT Room service appropriate? Yes; Fluid consistency: Thin  Diet effective now       Question Answer Comment  Room service appropriate? Yes   Fluid consistency: Thin      12/26/23 1425            Objective: Vitals:   12/27/23 0336 12/27/23 1435 12/27/23 1941 12/28/23 0452  BP: 139/75 (!) 158/85 (!) 163/78 (!) 160/86  Pulse: 92 87 86 82  Resp:  16 20 18   Temp: 98.4 F (36.9 C) 98 F (36.7 C) 98.5 F (36.9 C) 99 F (37.2 C)  TempSrc: Oral Oral Oral Oral  SpO2: 96% 96% 93% 90%  Weight:      Height:        Intake/Output Summary (  Last 24 hours) at 12/28/2023 0832 Last data filed at 12/28/2023 0801 Gross per 24 hour  Intake 480 ml  Output 3700 ml  Net -3220 ml   Filed Weights   12/26/23 0921  Weight: 94.3 kg    Scheduled Meds:  guaiFENesin   600 mg Oral BID   heparin   5,000 Units Subcutaneous Q8H   oseltamivir   30 mg Oral BID   Continuous Infusions:   azithromycin  500 mg (12/27/23 1000)   cefTRIAXone  (ROCEPHIN )  IV 1 g (12/27/23 1501)    Nutritional status     Body mass index is 34.61 kg/m.  Data Reviewed:   CBC: Recent Labs  Lab 12/26/23 0934 12/27/23 0510  WBC 8.2 6.5  HGB 13.3 12.8*  HCT 41.8 40.7  MCV 94.6 94.2  PLT 112* 100*   Basic Metabolic Panel: Recent Labs  Lab 12/26/23 0934 12/27/23 0510  NA 139 140  K 4.5 4.6  CL 102 104  CO2 23 29  GLUCOSE 111* 97  BUN 21 22  CREATININE 1.45* 1.40*  CALCIUM 8.8* 8.7*   GFR: Estimated Creatinine Clearance: 54.8 mL/min (A) (by C-G formula based on SCr of 1.4 mg/dL (H)). Liver Function Tests: Recent Labs  Lab 12/26/23 0934  AST 76*  ALT 51*  ALKPHOS 57  BILITOT 0.4  PROT 7.4  ALBUMIN 3.9   No results for input(s): LIPASE, AMYLASE in the last 168 hours. No results for input(s): AMMONIA in the last 168 hours. Coagulation Profile: No results for input(s): INR, PROTIME in the last 168 hours. Cardiac Enzymes: No results for input(s): CKTOTAL, CKMB, CKMBINDEX, TROPONINI in the last 168 hours. BNP (last 3 results) No results for input(s): PROBNP in the last 8760 hours. HbA1C: No results for input(s): HGBA1C in the last 72 hours. CBG: No results for input(s): GLUCAP in the last 168 hours. Lipid Profile: No results for input(s): CHOL, HDL, LDLCALC, TRIG, CHOLHDL, LDLDIRECT in the last 72 hours. Thyroid Function Tests: No results for input(s): TSH, T4TOTAL, FREET4, T3FREE, THYROIDAB in the last 72 hours. Anemia Panel: No results for input(s): VITAMINB12, FOLATE, FERRITIN, TIBC, IRON, RETICCTPCT in the last 72 hours. Sepsis Labs: Recent Labs  Lab 12/26/23 1042 12/26/23 1336  LATICACIDVEN 1.7 1.1    Recent Results (from the past 240 hours)  Resp panel by RT-PCR (RSV, Flu A&B, Covid) Anterior Nasal Swab     Status: Abnormal   Collection Time: 12/26/23  9:34 AM   Specimen: Anterior Nasal Swab   Result Value Ref Range Status   SARS Coronavirus 2 by RT PCR NEGATIVE NEGATIVE Final   Influenza A by PCR POSITIVE (A) NEGATIVE Final   Influenza B by PCR NEGATIVE NEGATIVE Final   Resp Syncytial Virus by PCR NEGATIVE NEGATIVE Final    Comment: Performed at [facility], [address], [place], [place] [address]  Blood culture (routine x 2)     Status: None (Preliminary result)   Collection Time: 12/26/23 10:42 AM   Specimen: Left Antecubital; Blood  Result Value Ref Range Status   Specimen Description   Final    LEFT ANTECUBITAL BOTTLES DRAWN AEROBIC AND ANAEROBIC   Special Requests Blood Culture adequate volume  Final   Culture   Final    NO GROWTH < 24 HOURS Performed at [facility], [address], [place], [place] [address]    Report Status PENDING  Incomplete  Blood culture (routine x 2)     Status: None (Preliminary result)   Collection Time: 12/26/23 10:42 AM  Specimen: BLOOD LEFT HAND  Result Value Ref Range Status   Specimen Description   Final    BLOOD LEFT HAND BOTTLES DRAWN AEROBIC AND ANAEROBIC   Special Requests Blood Culture adequate volume  Final   Culture   Final    NO GROWTH < 24 HOURS Performed at [facility], [address], [place], [place] [address]    Report Status PENDING  Incomplete         Radiology Studies: DG Chest Portable 1 View Result Date: 12/26/2023 EXAM: 1 VIEW(S) XRAY OF THE CHEST 12/26/2023 01:06:00 PM COMPARISON: None available. CLINICAL HISTORY: sob FINDINGS: LUNGS AND PLEURA: Low lung volumes. Bilateral perihilar interstitial opacities with mild elevation of the right hemidiaphragm. No pleural effusion. No pneumothorax. HEART AND MEDIASTINUM: Borderline cardiomegaly. Aortic atherosclerosis. BONES AND SOFT TISSUES: Multilevel degenerative changes of the thoracic spine. No acute osseous abnormality. IMPRESSION: 1. Low lung volumes with bilateral perihilar interstitial opacities, which may be due to bronchovascular crowding, interstitial  edema, or atypical/viral infection, in the correct clinical context. Electronically signed by: Rogelia Myers MD 12/26/2023 01:17 PM EST RP Workstation: [id number]   CT Head Wo Contrast Result Date: 12/26/2023 EXAM: CT HEAD WITHOUT CONTRAST 12/26/2023 11:56:14 AM TECHNIQUE: CT of the head was performed without the administration of intravenous contrast. Automated exposure control, iterative reconstruction, and/or weight based adjustment of the mA/kV was utilized to reduce the radiation dose to as low as reasonably achievable. COMPARISON: None available. CLINICAL HISTORY: 68 year old male with fever, cough, weakness, and unobserved fall. FINDINGS: BRAIN AND VENTRICLES: No acute hemorrhage. No evidence of acute infarct. No hydrocephalus. No extra-axial collection. No mass effect or midline shift. Mild calcified atherosclerosis at the skull base. Chronic lacunar infarct in the ventral left thalamus (series 2 image 40). Generalized cerebral volume loss seems advanced for age. Questionable disproportionate atrophy also in the bilateral lower cerebellum (series 2 image 18), nonspecific. Maintained gray white differentiation otherwise. No suspicious intracranial vascular hyperdensity. ORBITS: No gaze deviation. SINUSES: Mild to moderate paranasal sinus mucosal thickening primarily affecting the ethmoids. Bubbly opacity in the right frontoethmoidal recess. No layering sinus fluid. Tympanic cavities and mastoids appear clear. SOFT TISSUES AND SKULL: No acute soft tissue abnormality. No skull fracture. IMPRESSION: 1. No acute intracranial abnormality. 2. Nonspecific advanced for age brain volume loss, especially the lower cerebellum. Chronic left thalamic lacunar infarct. 3. Acute paranasal sinus inflammation. Electronically signed by: Helayne Hurst MD 12/26/2023 12:11 PM EST RP Workstation: [id number]           LOS: 2 days   Time spent= 35 mins    Deliliah Room, MD [facility]  If 7PM-7AM, please  contact night-coverage  12/28/2023, 8:32 AM  "

## 2023-12-28 NOTE — TOC Progression Note (Signed)
 Transition of Care [facility]) - Progression Note    Patient Details  Name: Dylan Mahoney MRN: [id number] Date of Birth: 11-07-1955  Transition [facility]) CM/SW Contact  Ronnald MARLA Sil, RN Phone Number: 12/28/2023, 2:27 PM  Clinical Narrative:    Patient discussed during Progression rounds where Dr Deliliah indicated he is approaching medical readiness to discharge.  CM conducted chart review and noted patient is a resident of [facility] and has PT recommendation for SNF placement for STR.  CM reviewed 6- SNF referrals that were disseminated and noted only 1 - bed offer from [facility], 2- CONSIDERING from [place] and [place], and 3 -PENDING from [place], [place], and [place].  CM attempted to contact patient's Sister Legal Guardian - [name] and reached her voicemail, left msg notifying of bed offer and requesting call back for opportunity to discuss.  CM will continue to follow along and assist as appropriate.   Expected Discharge Plan: Skilled Nursing Facility Barriers to Discharge: Continued Medical Work up   Expected Discharge Plan and Services In-house Referral: Clinical Social Work Discharge Planning Services: NA Post Acute Care Choice: Skilled Nursing Facility Living arrangements for the past 2 months: Skilled Nursing Facility                 DME Arranged: N/A DME Agency: NA  Social Drivers of Health (SDOH) Interventions SDOH Screenings   Food Insecurity: Patient Unable To Answer (12/26/2023)  Housing: Unknown (12/26/2023)  Transportation Needs: Patient Unable To Answer (12/26/2023)  Utilities: Patient Unable To Answer (12/26/2023)  Financial Resource Strain: Low Risk  (04/18/2022)   Received from [facility]  Social Connections: Patient Unable To Answer (12/26/2023)  Tobacco Use: Medium Risk (12/26/2023)    Readmission Risk Interventions    12/27/2023   12:00 PM  Readmission Risk Prevention Plan  Post Dischage Appt Complete   Medication Screening Complete

## 2023-12-29 DIAGNOSIS — J9601 Acute respiratory failure with hypoxia: Secondary | ICD-10-CM | POA: Diagnosis not present

## 2023-12-29 DIAGNOSIS — J101 Influenza due to other identified influenza virus with other respiratory manifestations: Secondary | ICD-10-CM | POA: Diagnosis not present

## 2023-12-29 NOTE — Progress Notes (Signed)
 " Progress Note   Patient: Dylan Mahoney FMW:[id number] DOB: 12-Apr-1955 DOA: 12/26/2023  DOS: the patient was seen and examined on 12/29/2023   Brief hospital course:  68 y.o. male with medical history significant of hypertension and schizoaffective disorder, presented to the emergency room after an unwitnessed fall and generalized weakness.  Admitted for management of acute respiratory failure with hypoxia in the setting of influenza A infection.  Assessment and Plan:  Acute hypoxic respiratory failure - Initially requiring 2-3 L nasal cannula.  Able to be weaned down to room air.  Acute influenza A - Tamiflu  on board twice daily.  Conservative management.  Hypertension - Hydralazine  as needed.  Physical debilitation muscle weakness - Likely secondary to above.  PT OT on board.  Recommending STR.  Disposition - Likely ready for SNF.  Patient is from [place].  Subjective: Patient resting comfortably this morning.  Denies any fever, chills, chest pain, nausea, vomiting, abdominal pain.  Physical Exam:  Vitals:   12/28/23 0839 12/28/23 1346 12/28/23 2005 12/29/23 0532  BP: (!) 153/86 (!) 157/96 (!) 148/86 (!) 149/90  Pulse: 76 91 76 78  Resp: 18  18 18   Temp: 98.3 F (36.8 C) 98.5 F (36.9 C) 97.8 F (36.6 C) 98.6 F (37 C)  TempSrc: Oral  Oral Oral  SpO2: 93% 91% 91% 96%  Weight:      Height:        GENERAL:  Alert, pleasant, no acute distress  HEENT:  EOMI CARDIOVASCULAR:  RRR, no murmurs appreciated RESPIRATORY:  Clear to auscultation, no wheezing, rales, or rhonchi GASTROINTESTINAL:  Soft, nontender, nondistended EXTREMITIES:  No LE edema bilaterally NEURO:  No new focal deficits appreciated SKIN:  No rashes noted PSYCH:  Appropriate mood and affect     Data Reviewed:  Imaging Studies: DG Chest Portable 1 View Result Date: 12/26/2023 EXAM: 1 VIEW(S) XRAY OF THE CHEST 12/26/2023 01:06:00 PM COMPARISON: None available. CLINICAL HISTORY: sob FINDINGS:  LUNGS AND PLEURA: Low lung volumes. Bilateral perihilar interstitial opacities with mild elevation of the right hemidiaphragm. No pleural effusion. No pneumothorax. HEART AND MEDIASTINUM: Borderline cardiomegaly. Aortic atherosclerosis. BONES AND SOFT TISSUES: Multilevel degenerative changes of the thoracic spine. No acute osseous abnormality. IMPRESSION: 1. Low lung volumes with bilateral perihilar interstitial opacities, which may be due to bronchovascular crowding, interstitial edema, or atypical/viral infection, in the correct clinical context. Electronically signed by: Rogelia Myers MD 12/26/2023 01:17 PM EST RP Workstation: [id number]   CT Head Wo Contrast Result Date: 12/26/2023 EXAM: CT HEAD WITHOUT CONTRAST 12/26/2023 11:56:14 AM TECHNIQUE: CT of the head was performed without the administration of intravenous contrast. Automated exposure control, iterative reconstruction, and/or weight based adjustment of the mA/kV was utilized to reduce the radiation dose to as low as reasonably achievable. COMPARISON: None available. CLINICAL HISTORY: 68 year old male with fever, cough, weakness, and unobserved fall. FINDINGS: BRAIN AND VENTRICLES: No acute hemorrhage. No evidence of acute infarct. No hydrocephalus. No extra-axial collection. No mass effect or midline shift. Mild calcified atherosclerosis at the skull base. Chronic lacunar infarct in the ventral left thalamus (series 2 image 40). Generalized cerebral volume loss seems advanced for age. Questionable disproportionate atrophy also in the bilateral lower cerebellum (series 2 image 18), nonspecific. Maintained gray white differentiation otherwise. No suspicious intracranial vascular hyperdensity. ORBITS: No gaze deviation. SINUSES: Mild to moderate paranasal sinus mucosal thickening primarily affecting the ethmoids. Bubbly opacity in the right frontoethmoidal recess. No layering sinus fluid. Tympanic cavities and mastoids appear clear. SOFT TISSUES AND  SKULL: No acute soft tissue abnormality. No skull fracture. IMPRESSION: 1. No acute intracranial abnormality. 2. Nonspecific advanced for age brain volume loss, especially the lower cerebellum. Chronic left thalamic lacunar infarct. 3. Acute paranasal sinus inflammation. Electronically signed by: Helayne Hurst MD 12/26/2023 12:11 PM EST RP Workstation: [id number]    There are no new results to review at this time.  Previous records (including but not limited to H&P, progress notes, nursing notes, TOC management) were reviewed in assessment of this patient.  Labs: CBC: Recent Labs  Lab 12/26/23 0934 12/27/23 0510  WBC 8.2 6.5  HGB 13.3 12.8*  HCT 41.8 40.7  MCV 94.6 94.2  PLT 112* 100*   Basic Metabolic Panel: Recent Labs  Lab 12/26/23 0934 12/27/23 0510  NA 139 140  K 4.5 4.6  CL 102 104  CO2 23 29  GLUCOSE 111* 97  BUN 21 22  CREATININE 1.45* 1.40*  CALCIUM 8.8* 8.7*   Liver Function Tests: Recent Labs  Lab 12/26/23 0934  AST 76*  ALT 51*  ALKPHOS 57  BILITOT 0.4  PROT 7.4  ALBUMIN 3.9   CBG: No results for input(s): GLUCAP in the last 168 hours.  Scheduled Meds:  guaiFENesin   600 mg Oral BID   heparin   5,000 Units Subcutaneous Q8H   oseltamivir   30 mg Oral BID   Continuous Infusions: PRN Meds:.acetaminophen  **OR** acetaminophen , albuterol , bisacodyl , hydrALAZINE , ondansetron  **OR** ondansetron  (ZOFRAN ) IV, senna-docusate  Family Communication: None at bedside  Disposition: Status is: Inpatient Remains inpatient appropriate because: dispo     Time spent: 32 minutes  Length of inpatient stay: 3 days  Author: Carliss LELON Canales, DO 12/29/2023 11:44 AM  For on call review [email or web address].   "

## 2023-12-29 NOTE — Hospital Course (Signed)
 68 y.o. male with medical history significant of hypertension and schizoaffective disorder, presented to the emergency room after an unwitnessed fall and generalized weakness.  Admitted for management of acute respiratory failure with hypoxia in the setting of influenza A infection.   Assessment and Plan:   Acute hypoxic respiratory failure - Initially requiring 2-3 L nasal cannula.  Able to be weaned down to room air.   Acute influenza A - Tamiflu  on board twice daily.  Conservative management.   Hypertension - Hydralazine  as needed.   Physical debilitation muscle weakness - Likely secondary to above.  PT OT on board.  Recommending STR.   Disposition - Likely ready for SNF.  Patient is from [place].

## 2023-12-29 NOTE — Plan of Care (Signed)

## 2023-12-30 DIAGNOSIS — J101 Influenza due to other identified influenza virus with other respiratory manifestations: Secondary | ICD-10-CM | POA: Diagnosis not present

## 2023-12-30 LAB — BASIC METABOLIC PANEL WITH GFR
Anion gap: 9 (ref 5–15)
BUN: 22 mg/dL (ref 8–23)
CO2: 26 mmol/L (ref 22–32)
Calcium: 8.9 mg/dL (ref 8.9–10.3)
Chloride: 102 mmol/L (ref 98–111)
Creatinine, Ser: 1.15 mg/dL (ref 0.61–1.24)
GFR, Estimated: >60 mL/min
Glucose, Bld: 139 mg/dL — ABNORMAL HIGH (ref 70–99)
Potassium: 4.8 mmol/L (ref 3.5–5.1)
Sodium: 137 mmol/L (ref 135–145)

## 2023-12-30 LAB — CBC
HCT: 45.6 % (ref 39.0–52.0)
Hemoglobin: 14.7 g/dL (ref 13.0–17.0)
MCH: 29.6 pg (ref 26.0–34.0)
MCHC: 32.2 g/dL (ref 30.0–36.0)
MCV: 91.8 fL (ref 80.0–100.0)
Platelets: 132 K/uL — ABNORMAL LOW (ref 150–400)
RBC: 4.97 MIL/uL (ref 4.22–5.81)
RDW: 14.5 % (ref 11.5–15.5)
WBC: 7 K/uL (ref 4.0–10.5)
nRBC: 0 % (ref 0.0–0.2)

## 2023-12-30 MED ORDER — OSELTAMIVIR PHOSPHATE 75 MG PO CAPS
75.0000 mg | ORAL_CAPSULE | Freq: Two times a day (BID) | ORAL | Status: AC
  Administered 2023-12-30 – 2023-12-31 (×4): 75 mg via ORAL
  Filled 2023-12-30 (×4): qty 1

## 2023-12-30 NOTE — TOC Progression Note (Signed)
 Transition of Care [facility]) - Progression Note    Patient Details  Name: Dylan Mahoney MRN: [id number] Date of Birth: [birth year]  [facility] CM/SW Contact  Noreen KATHEE [place], [place] Phone Number: 12/30/2023, 3:46 PM  Clinical Narrative:     CSW called Debbie this morning about bed availability and Debbie shared that she would have to see if she will have one for patient today and to follow up with her later. CSW reached back out to Rosa Sanchez and she expressed that she would not have a bed until tomorrow. Family made aware.   Expected Discharge Plan: Skilled Nursing Facility Barriers to Discharge: No SNF bed       Expected Discharge Plan and Services In-house Referral: Clinical Social Work Discharge Planning Services: NA Post Acute Care Choice: Skilled Nursing Facility Living arrangements for the past 2 months: Skilled Nursing Facility                 DME Arranged: N/A DME Agency: NA                   Social Drivers of Health (SDOH) Interventions SDOH Screenings   Food Insecurity: Patient Unable To Answer (12/26/2023)  Housing: Unknown (12/26/2023)  Transportation Needs: Patient Unable To Answer (12/26/2023)  Utilities: Patient Unable To Answer (12/26/2023)  Financial Resource Strain: Low Risk  (04/18/2022)   Received from [facility]  Social Connections: Patient Unable To Answer (12/26/2023)  Tobacco Use: Medium Risk (12/26/2023)    Readmission Risk Interventions    12/30/2023    3:46 PM 12/27/2023   12:00 PM  Readmission Risk Prevention Plan  Post Dischage Appt  Complete  Medication Screening Complete Complete  Transportation Screening Complete

## 2023-12-30 NOTE — Progress Notes (Signed)
 " Progress Note   Patient: Dylan Mahoney FMW:[id number] DOB: 08/08/1955 DOA: 12/26/2023  DOS: the patient was seen and examined on 12/30/2023   Brief hospital course:  68 y.o. male with medical history significant of hypertension and schizoaffective disorder, presented to the emergency room after an unwitnessed fall and generalized weakness.  Admitted for management of acute respiratory failure with hypoxia in the setting of influenza A infection.  Assessment and Plan:  Acute hypoxic respiratory failure - Initially requiring 2-3 L nasal cannula.  Able to be weaned down to room air.  Acute influenza A - Tamiflu  on board twice daily.  Conservative management.  Hypertension - Hydralazine  as needed.  Physical debilitation muscle weakness - Likely secondary to above.  PT OT on board.  Recommending STR.  Disposition - Likely ready for SNF.  Patient is from [place].  Subjective: Patient resting comfortably this morning.  Denies any fever, chills, chest pain, nausea, vomiting, abdominal pain.  Physical Exam:  Vitals:   12/29/23 2039 12/30/23 0445 12/30/23 0609 12/30/23 1330  BP: (!) 163/78 (!) 182/148 (!) 121/57 (!) 165/86  Pulse: 76 91 97 (!) 104  Resp: 18 17  18   Temp: 98.2 F (36.8 C) 98 F (36.7 C)  98.2 F (36.8 C)  TempSrc: Oral Oral    SpO2: 96% 92%  93%  Weight:      Height:        GENERAL:  Alert, pleasant, no acute distress  HEENT:  EOMI CARDIOVASCULAR:  RRR, no murmurs appreciated RESPIRATORY:  Clear to auscultation, no wheezing, rales, or rhonchi GASTROINTESTINAL:  Soft, nontender, nondistended EXTREMITIES:  No LE edema bilaterally NEURO:  No new focal deficits appreciated SKIN:  No rashes noted PSYCH:  Appropriate mood and affect     Data Reviewed:  Imaging Studies: DG Chest Portable 1 View Result Date: 12/26/2023 EXAM: 1 VIEW(S) XRAY OF THE CHEST 12/26/2023 01:06:00 PM COMPARISON: None available. CLINICAL HISTORY: sob FINDINGS: LUNGS AND PLEURA:  Low lung volumes. Bilateral perihilar interstitial opacities with mild elevation of the right hemidiaphragm. No pleural effusion. No pneumothorax. HEART AND MEDIASTINUM: Borderline cardiomegaly. Aortic atherosclerosis. BONES AND SOFT TISSUES: Multilevel degenerative changes of the thoracic spine. No acute osseous abnormality. IMPRESSION: 1. Low lung volumes with bilateral perihilar interstitial opacities, which may be due to bronchovascular crowding, interstitial edema, or atypical/viral infection, in the correct clinical context. Electronically signed by: Rogelia Myers MD 12/26/2023 01:17 PM EST RP Workstation: [id number]   CT Head Wo Contrast Result Date: 12/26/2023 EXAM: CT HEAD WITHOUT CONTRAST 12/26/2023 11:56:14 AM TECHNIQUE: CT of the head was performed without the administration of intravenous contrast. Automated exposure control, iterative reconstruction, and/or weight based adjustment of the mA/kV was utilized to reduce the radiation dose to as low as reasonably achievable. COMPARISON: None available. CLINICAL HISTORY: 68 year old male with fever, cough, weakness, and unobserved fall. FINDINGS: BRAIN AND VENTRICLES: No acute hemorrhage. No evidence of acute infarct. No hydrocephalus. No extra-axial collection. No mass effect or midline shift. Mild calcified atherosclerosis at the skull base. Chronic lacunar infarct in the ventral left thalamus (series 2 image 40). Generalized cerebral volume loss seems advanced for age. Questionable disproportionate atrophy also in the bilateral lower cerebellum (series 2 image 18), nonspecific. Maintained gray white differentiation otherwise. No suspicious intracranial vascular hyperdensity. ORBITS: No gaze deviation. SINUSES: Mild to moderate paranasal sinus mucosal thickening primarily affecting the ethmoids. Bubbly opacity in the right frontoethmoidal recess. No layering sinus fluid. Tympanic cavities and mastoids appear clear. SOFT TISSUES AND SKULL: No  acute  soft tissue abnormality. No skull fracture. IMPRESSION: 1. No acute intracranial abnormality. 2. Nonspecific advanced for age brain volume loss, especially the lower cerebellum. Chronic left thalamic lacunar infarct. 3. Acute paranasal sinus inflammation. Electronically signed by: Helayne Hurst MD 12/26/2023 12:11 PM EST RP Workstation: [id number]    There are no new results to review at this time.  Previous records (including but not limited to H&P, progress notes, nursing notes, TOC management) were reviewed in assessment of this patient.  Labs: CBC: Recent Labs  Lab 12/26/23 0934 12/27/23 0510 12/30/23 0419  WBC 8.2 6.5 7.0  HGB 13.3 12.8* 14.7  HCT 41.8 40.7 45.6  MCV 94.6 94.2 91.8  PLT 112* 100* 132*   Basic Metabolic Panel: Recent Labs  Lab 12/26/23 0934 12/27/23 0510 12/30/23 0419  NA 139 140 137  K 4.5 4.6 4.8  CL 102 104 102  CO2 23 29 26   GLUCOSE 111* 97 139*  BUN 21 22 22   CREATININE 1.45* 1.40* 1.15  CALCIUM 8.8* 8.7* 8.9   Liver Function Tests: Recent Labs  Lab 12/26/23 0934  AST 76*  ALT 51*  ALKPHOS 57  BILITOT 0.4  PROT 7.4  ALBUMIN 3.9   CBG: No results for input(s): GLUCAP in the last 168 hours.  Scheduled Meds:  guaiFENesin   600 mg Oral BID   heparin   5,000 Units Subcutaneous Q8H   oseltamivir   75 mg Oral BID   Continuous Infusions: PRN Meds:.acetaminophen  **OR** acetaminophen , albuterol , bisacodyl , hydrALAZINE , ondansetron  **OR** ondansetron  (ZOFRAN ) IV, senna-docusate  Family Communication: None at bedside  Disposition: Status is: Inpatient Remains inpatient appropriate because: dispo     Time spent: 31 minutes  Length of inpatient stay: 4 days  Author: Carliss LELON Canales, DO 12/30/2023 1:44 PM  For on call review [email or web address]   "

## 2023-12-30 NOTE — Plan of Care (Signed)
" °  Problem: Activity: Goal: Risk for activity intolerance will decrease Outcome: Not Progressing   Problem: Safety: Goal: Ability to remain free from injury will improve Outcome: Not Progressing   Problem: Clinical Measurements: Goal: Will remain free from infection Outcome: Not Progressing   "

## 2023-12-30 NOTE — Progress Notes (Signed)
 Mobility Specialist Progress Note:    12/30/23 1005  Mobility  Activity Pivoted/transferred from bed to chair  Level of Assistance Contact guard assist, steadying assist  Assistive Device None  Distance Ambulated (ft) 3 ft  Range of Motion/Exercises Active;All extremities  Activity Response Tolerated well  Mobility Referral Yes  Mobility visit 1 Mobility  Mobility Specialist Start Time (ACUTE ONLY) 1005  Mobility Specialist Stop Time (ACUTE ONLY) 1025  Mobility Specialist Time Calculation (min) (ACUTE ONLY) 20 min   Pt received supine, agreeable to mobility. Required CGA to stand and transfer with no AD. Tolerated well, asx throughout. Call bell in reach, alarm on. NT in room,all needs met.  Delos Klich Mobility Specialist Please contact via Special Educational Needs Teacher or  Rehab office at [phone]

## 2023-12-31 DIAGNOSIS — J9601 Acute respiratory failure with hypoxia: Secondary | ICD-10-CM | POA: Diagnosis not present

## 2023-12-31 DIAGNOSIS — F259 Schizoaffective disorder, unspecified: Secondary | ICD-10-CM | POA: Diagnosis not present

## 2023-12-31 DIAGNOSIS — J101 Influenza due to other identified influenza virus with other respiratory manifestations: Secondary | ICD-10-CM | POA: Diagnosis not present

## 2023-12-31 LAB — CULTURE, BLOOD (ROUTINE X 2)
Culture: NO GROWTH
Culture: NO GROWTH
Special Requests: ADEQUATE
Special Requests: ADEQUATE

## 2023-12-31 NOTE — Care Management Important Message (Signed)
 Important Message  Patient Details  Name: Dylan Mahoney MRN: [id number] Date of Birth: [birth year]   Important Message Given:  Yes - Medicare IM     Germaine Shenker L Laqueta Bonaventura 12/31/2023, 11:49 AM

## 2023-12-31 NOTE — TOC Progression Note (Signed)
 Transition of Care [facility]) - Progression Note    Patient Details  Name: Dylan Mahoney MRN: [id number] Date of Birth: [birth year]  [facility] CM/SW Contact  Noreen KATHEE [place], [place] Phone Number: 12/31/2023, 2:50 PM  Clinical Narrative:     CSW reached out to [facility] about bed availability for  today since she shared that it would be ready today. Marval stated that they had some issues with room changes and it will be tomorrow morning . Family made aware.  Expected Discharge Plan: Skilled Nursing Facility Barriers to Discharge: No SNF bed               Expected Discharge Plan and Services In-house Referral: Clinical Social Work Discharge Planning Services: NA Post Acute Care Choice: Skilled Nursing Facility Living arrangements for the past 2 months: Skilled Nursing Facility Expected Discharge Date: 12/31/23               DME Arranged: N/A DME Agency: NA                   Social Drivers of Health (SDOH) Interventions SDOH Screenings   Food Insecurity: Patient Unable To Answer (12/26/2023)  Housing: Unknown (12/26/2023)  Transportation Needs: Patient Unable To Answer (12/26/2023)  Utilities: Patient Unable To Answer (12/26/2023)  Financial Resource Strain: Low Risk  (04/18/2022)   Received from [facility]  Social Connections: Patient Unable To Answer (12/26/2023)  Tobacco Use: Medium Risk (12/26/2023)    Readmission Risk Interventions    12/31/2023    2:50 PM 12/30/2023    3:46 PM 12/27/2023   12:00 PM  Readmission Risk Prevention Plan  Post Dischage Appt   Complete  Medication Screening Complete Complete Complete  Transportation Screening Complete Complete

## 2023-12-31 NOTE — Discharge Summary (Signed)
 " Physician Discharge Summary   Patient: Dylan Mahoney MRN: [id number] DOB: Jan 16, 1955  Admit date:     12/26/2023  Discharge date: 12/31/2023  Discharge Physician: Carliss LELON Canales   PCP: [name], MD   Recommendations at discharge:    Pt to be discharged to SNF.   If you experience worsening fever, chills, chest pain, shortness of breath, or other concerning symptoms, please call your PCP or go to the emergency department immediately.  Discharge Diagnoses: Principal Problem:   Influenza A  Resolved Problems:   * No resolved hospital problems. *   Hospital Course:  68 y.o. male with medical history significant of hypertension and schizoaffective disorder, presented to the emergency room after an unwitnessed fall and generalized weakness.  Admitted for management of acute respiratory failure with hypoxia in the setting of influenza A infection.   Assessment and Plan:   Acute hypoxic respiratory failure - Initially requiring 2-3 L nasal cannula.  Able to be weaned down to room air.   Acute influenza A - Completed Tamiflu  therapy.  Respiratory failure resolved.  No need for ongoing medical therapy.   Hypertension - Hydralazine  as needed.   Physical debilitation muscle weakness - Likely secondary to above.  PT OT on board.  Recommending STR.  Schizoaffective disorder - Resume olanzapine, Depakote.  Has not needed Ativan or Haldol during hospital stay, will discontinue.    Consultants: None Procedures performed: None Disposition: Skilled nursing facility Diet recommendation:  Discharge Diet Orders (From admission, onward)     Start     Ordered   12/31/23 0000  Diet - low sodium heart healthy        12/31/23 1010           Cardiac diet  DISCHARGE MEDICATION: Allergies as of 12/31/2023   No Known Allergies      Medication List     STOP taking these medications    haloperidol 2 MG tablet Commonly known as: HALDOL   LORazepam 0.5 MG  tablet Commonly known as: ATIVAN   LORazepam 1 MG tablet Commonly known as: ATIVAN       TAKE these medications    acetaminophen  500 MG tablet Commonly known as: TYLENOL  Take 500 mg by mouth every 6 (six) hours as needed for mild pain (pain score 1-3), moderate pain (pain score 4-6) or fever.   ascorbic acid 1000 MG tablet Commonly known as: VITAMIN C Take 1,000 mg by mouth every morning.   aspirin EC 81 MG tablet Take 81 mg by mouth every morning.   atenolol 25 MG tablet Commonly known as: TENORMIN Take 12.5 mg by mouth every morning.   cetirizine 10 MG tablet Commonly known as: ZYRTEC Take 10 mg by mouth every evening.   Chloraseptic Sore Throat 6-10 MG lozenge Generic drug: benzocaine-menthol Take 1 lozenge by mouth every 4 (four) hours as needed for sore throat.   D3-1000 25 MCG (1000 UT) tablet Generic drug: Cholecalciferol Take 1,000 Units by mouth every morning.   divalproex 500 MG DR tablet Commonly known as: DEPAKOTE Take 1,000 mg by mouth at bedtime.   divalproex 250 MG DR tablet Commonly known as: DEPAKOTE Take 250 mg by mouth 2 (two) times daily. Given at 0800 and 1400   famotidine 20 MG tablet Commonly known as: PEPCID Take 20 mg by mouth every evening.   finasteride 5 MG tablet Commonly known as: PROSCAR Take 5 mg by mouth every morning.   levothyroxine 100 MCG tablet Commonly known  as: SYNTHROID Take 100 mcg by mouth daily before breakfast.   meloxicam 15 MG tablet Commonly known as: MOBIC Take 15 mg by mouth every evening.   OLANZapine zydis 20 MG disintegrating tablet Commonly known as: ZYPREXA Take 20 mg by mouth at bedtime.   pantoprazole 20 MG tablet Commonly known as: PROTONIX Take 20 mg by mouth every morning.   tamsulosin 0.4 MG Caps capsule Commonly known as: FLOMAX Take 0.4 mg by mouth every evening. Given at 1700         Discharge Exam: Filed Weights   12/26/23 0921  Weight: 94.3 kg    GENERAL:  Alert,  pleasant, no acute distress  HEENT:  EOMI CARDIOVASCULAR:  RRR, no murmurs appreciated RESPIRATORY:  Clear to auscultation, no wheezing, rales, or rhonchi GASTROINTESTINAL:  Soft, nontender, nondistended EXTREMITIES:  No LE edema bilaterally NEURO:  No new focal deficits appreciated SKIN:  No rashes noted PSYCH:  Appropriate mood and affect    Condition at discharge: improving  The results of significant diagnostics from this hospitalization (including imaging, microbiology, ancillary and laboratory) are listed below for reference.   Imaging Studies: DG Chest Portable 1 View Result Date: 12/26/2023 EXAM: 1 VIEW(S) XRAY OF THE CHEST 12/26/2023 01:06:00 PM COMPARISON: None available. CLINICAL HISTORY: sob FINDINGS: LUNGS AND PLEURA: Low lung volumes. Bilateral perihilar interstitial opacities with mild elevation of the right hemidiaphragm. No pleural effusion. No pneumothorax. HEART AND MEDIASTINUM: Borderline cardiomegaly. Aortic atherosclerosis. BONES AND SOFT TISSUES: Multilevel degenerative changes of the thoracic spine. No acute osseous abnormality. IMPRESSION: 1. Low lung volumes with bilateral perihilar interstitial opacities, which may be due to bronchovascular crowding, interstitial edema, or atypical/viral infection, in the correct clinical context. Electronically signed by: Rogelia Myers MD 12/26/2023 01:17 PM EST RP Workstation: [id number]   CT Head Wo Contrast Result Date: 12/26/2023 EXAM: CT HEAD WITHOUT CONTRAST 12/26/2023 11:56:14 AM TECHNIQUE: CT of the head was performed without the administration of intravenous contrast. Automated exposure control, iterative reconstruction, and/or weight based adjustment of the mA/kV was utilized to reduce the radiation dose to as low as reasonably achievable. COMPARISON: None available. CLINICAL HISTORY: 68 year old male with fever, cough, weakness, and unobserved fall. FINDINGS: BRAIN AND VENTRICLES: No acute hemorrhage. No evidence of acute  infarct. No hydrocephalus. No extra-axial collection. No mass effect or midline shift. Mild calcified atherosclerosis at the skull base. Chronic lacunar infarct in the ventral left thalamus (series 2 image 40). Generalized cerebral volume loss seems advanced for age. Questionable disproportionate atrophy also in the bilateral lower cerebellum (series 2 image 18), nonspecific. Maintained gray white differentiation otherwise. No suspicious intracranial vascular hyperdensity. ORBITS: No gaze deviation. SINUSES: Mild to moderate paranasal sinus mucosal thickening primarily affecting the ethmoids. Bubbly opacity in the right frontoethmoidal recess. No layering sinus fluid. Tympanic cavities and mastoids appear clear. SOFT TISSUES AND SKULL: No acute soft tissue abnormality. No skull fracture. IMPRESSION: 1. No acute intracranial abnormality. 2. Nonspecific advanced for age brain volume loss, especially the lower cerebellum. Chronic left thalamic lacunar infarct. 3. Acute paranasal sinus inflammation. Electronically signed by: Helayne Hurst MD 12/26/2023 12:11 PM EST RP Workstation: [id number]    Microbiology: Results for orders placed or performed during the hospital encounter of 12/26/23  Resp panel by RT-PCR (RSV, Flu A&B, Covid) Anterior Nasal Swab     Status: Abnormal   Collection Time: 12/26/23  9:34 AM   Specimen: Anterior Nasal Swab  Result Value Ref Range Status   SARS Coronavirus 2 by RT PCR NEGATIVE NEGATIVE Final  Influenza A by PCR POSITIVE (A) NEGATIVE Final   Influenza B by PCR NEGATIVE NEGATIVE Final   Resp Syncytial Virus by PCR NEGATIVE NEGATIVE Final    Comment: Performed at [facility], [address] [place], [place] [address]  Blood culture (routine x 2)     Status: None   Collection Time: 12/26/23 10:42 AM   Specimen: Left Antecubital; Blood  Result Value Ref Range Status   Specimen Description   Final    LEFT ANTECUBITAL BOTTLES DRAWN AEROBIC AND ANAEROBIC   Special Requests  Blood Culture adequate volume  Final   Culture   Final    NO GROWTH 5 DAYS Performed at [facility], [address], [place], [place] [address]    Report Status 12/31/2023 FINAL  Final  Blood culture (routine x 2)     Status: None   Collection Time: 12/26/23 10:42 AM   Specimen: BLOOD LEFT HAND  Result Value Ref Range Status   Specimen Description   Final    BLOOD LEFT HAND BOTTLES DRAWN AEROBIC AND ANAEROBIC   Special Requests Blood Culture adequate volume  Final   Culture   Final    NO GROWTH 5 DAYS Performed at [facility], [address], [place], [place] [address]    Report Status 12/31/2023 FINAL  Final    Labs: CBC: Recent Labs  Lab 12/26/23 0934 12/27/23 0510 12/30/23 0419  WBC 8.2 6.5 7.0  HGB 13.3 12.8* 14.7  HCT 41.8 40.7 45.6  MCV 94.6 94.2 91.8  PLT 112* 100* 132*   Basic Metabolic Panel: Recent Labs  Lab 12/26/23 0934 12/27/23 0510 12/30/23 0419  NA 139 140 137  K 4.5 4.6 4.8  CL 102 104 102  CO2 23 29 26   GLUCOSE 111* 97 139*  BUN 21 22 22   CREATININE 1.45* 1.40* 1.15  CALCIUM 8.8* 8.7* 8.9   Liver Function Tests: Recent Labs  Lab 12/26/23 0934  AST 76*  ALT 51*  ALKPHOS 57  BILITOT 0.4  PROT 7.4  ALBUMIN 3.9   CBG: No results for input(s): GLUCAP in the last 168 hours.  Discharge time spent: 25 minutes.  Length of inpatient stay: 5 days  Signed: Carliss LELON Canales, DO [facility] 12/31/2023         "

## 2023-12-31 NOTE — Plan of Care (Signed)

## 2024-01-01 ENCOUNTER — Inpatient Hospital Stay (HOSPITAL_COMMUNITY)

## 2024-01-01 NOTE — Progress Notes (Signed)
 Mobility Specialist Progress Note:    01/01/24 1500  Mobility  Activity Pivoted/transferred from chair to bed  Level of Assistance Minimal assist, patient does 75% or more  Assistive Device None  Distance Ambulated (ft) 2 ft  Range of Motion/Exercises Active;All extremities  Activity Response Tolerated well  Mobility Referral Yes  Mobility visit 1 Mobility  Mobility Specialist Start Time (ACUTE ONLY) 1500  Mobility Specialist Stop Time (ACUTE ONLY) 1520  Mobility Specialist Time Calculation (min) (ACUTE ONLY) 20 min   Pt received in chair, requesting assistance back to bed. Required MinA to stand and pivot with no AD. Tolerated well, alarm on. All needs met.  Kordel Leavy Mobility Specialist Please contact via Special Educational Needs Teacher or  Rehab office at [phone]

## 2024-01-01 NOTE — TOC Transition Note (Addendum)
 Transition of Care [facility]) - Discharge Note   Patient Details  Name: Dylan Mahoney MRN: [id number] Date of Birth: [birth year]  [facility] CM/SW Contact:  Sharlyne Stabs, RN Phone Number: 01/01/2024, 12:05 PM   Clinical Narrative:   Patient is medically ready and bed confirmed at [facility]. RN calling report. IPCM calling EMS. Clinicals sent in the hub and med necessity printed on 300.   Updated [name] - Legal Guardian   Final next level of care: Skilled Nursing Facility Barriers to Discharge: Barriers Resolved   Patient Goals and CMS Choice Patient states their goals for this hospitalization and ongoing recovery are:: For pt to get rehab before returning to ALF CMS Medicare.gov Compare Post Acute Care list provided to:: Patient Represenative (must comment) Choice offered to / list presented to : Adult Children [place] ownership interest in [facility].provided to:: Adult Children    Discharge Placement                  Name of family member notified: attempted to call family - mail box full Patient and family notified of of transfer: 01/01/24  Discharge Plan and Services Additional resources added to the After Visit Summary for   In-house Referral: Clinical Social Work Discharge Planning Services: NA Post Acute Care Choice: Skilled Nursing Facility          DME Arranged: N/A DME Agency: NA                  Social Drivers of Health (SDOH) Interventions SDOH Screenings   Food Insecurity: Patient Unable To Answer (12/26/2023)  Housing: Unknown (12/26/2023)  Transportation Needs: Patient Unable To Answer (12/26/2023)  Utilities: Patient Unable To Answer (12/26/2023)  Financial Resource Strain: Low Risk  (04/18/2022)   Received from [facility]  Social Connections: Patient Unable To Answer (12/26/2023)  Tobacco Use: Medium Risk (12/26/2023)     Readmission Risk Interventions    12/31/2023    2:50 PM  12/30/2023    3:46 PM 12/27/2023   12:00 PM  Readmission Risk Prevention Plan  Post Dischage Appt   Complete  Medication Screening Complete Complete Complete  Transportation Screening Complete Complete

## 2024-01-01 NOTE — Progress Notes (Signed)
 I (nurse) successfully notified [name] (legal guardian) of his pending discharge back to [place] [facility] today.

## 2024-01-01 NOTE — Plan of Care (Signed)
" °  Problem: Education: Goal: Knowledge of General Education information will improve Description: Including pain rating scale, medication(s)/side effects and non-pharmacologic comfort measures Outcome: Progressing   Problem: Clinical Measurements: Goal: Ability to maintain clinical measurements within normal limits will improve Outcome: Progressing Goal: Respiratory complications will improve Outcome: Progressing   Problem: Activity: Goal: Risk for activity intolerance will decrease Outcome: Progressing   Problem: Pain Managment: Goal: General experience of comfort will improve and/or be controlled Outcome: Progressing   Problem: Safety: Goal: Ability to remain free from injury will improve Outcome: Progressing   Problem: Skin Integrity: Goal: Risk for impaired skin integrity will decrease Outcome: Progressing   "

## 2024-01-01 NOTE — Progress Notes (Signed)
 " PROGRESS NOTE    Dylan Mahoney  FMW:[id number] DOB: Jun 01, 1955 DOA: 12/26/2023 PCP: [name], MD   Brief Narrative:   68 y.o. male with medical history significant of hypertension and schizoaffective disorder, presented to the emergency room after an unwitnessed fall and generalized weakness.  Admitted for management of acute respiratory failure with hypoxia in the setting of influenza A infection.    Assessment & Plan:  Principal Problem:   Influenza A   Acute hypoxic respiratory failure, resolved - Initially requiring 2-3 L nasal cannula.  Able to be weaned down to room air.   Acute influenza A - Completed Tamiflu  therapy.  Respiratory failure resolved.  No need for ongoing medical therapy.   Hypertension - Hydralazine  as needed.   Physical debilitation muscle weakness - Likely secondary to above.  PT OT on board.  Recommending STR.   Schizoaffective disorder - Resumed olanzapine, Depakote.  Has not needed Ativan or Haldol during hospital stay, will discontinue.  Disposition: [facility] rehab   DVT prophylaxis: heparin  injection 5,000 Units Start: 12/26/23 2200     Code Status: Full Code Family Communication: None at the bedside Status is: Inpatient Remains inpatient appropriate because: Pending placement    Subjective: No acute events overnight.  Patient is alert and awake.  Not in any distress.  He will be discharged to [facility] rehab today   Examination:  General exam: Appears calm and comfortable  Respiratory system: Clear to auscultation. Respiratory effort normal. Cardiovascular system: S1 & S2 heard, RRR. No JVD, murmurs, rubs, gallops or clicks. No pedal edema. Gastrointestinal system: Abdomen is nondistended, soft and nontender. No organomegaly or masses felt. Normal bowel sounds heard. Central nervous system: Alert and oriented x 1. No focal neurological deficits. Extremities: Symmetric 5 x 5 power. Skin: No rashes, lesions  or ulcers      Diet Orders (From admission, onward)     Start     Ordered   12/31/23 0000  Diet - low sodium heart healthy        12/31/23 1010   12/26/23 1425  DIET SOFT Room service appropriate? Yes; Fluid consistency: Thin  Diet effective now       Question Answer Comment  Room service appropriate? Yes   Fluid consistency: Thin      12/26/23 1425            Objective: Vitals:   12/31/23 1019 12/31/23 1355 12/31/23 2110 01/01/24 0424  BP: (!) 174/96 (!) 167/92 (!) 157/82 (!) 161/89  Pulse: (!) 106 (!) 106 84 90  Resp: 18 20 18 18   Temp: 98.4 F (36.9 C) 98.4 F (36.9 C) 98.4 F (36.9 C) 98.2 F (36.8 C)  TempSrc: Oral Oral    SpO2: 93% 100% 97% 97%  Weight:      Height:        Intake/Output Summary (Last 24 hours) at 01/01/2024 0917 Last data filed at 01/01/2024 0500 Gross per 24 hour  Intake 1120 ml  Output 1000 ml  Net 120 ml   Filed Weights   12/26/23 0921  Weight: 94.3 kg    Scheduled Meds:  guaiFENesin   600 mg Oral BID   heparin   5,000 Units Subcutaneous Q8H   Continuous Infusions:  Nutritional status     Body mass index is 34.61 kg/m.  Data Reviewed:   CBC: Recent Labs  Lab 12/26/23 0934 12/27/23 0510 12/30/23 0419  WBC 8.2 6.5 7.0  HGB 13.3 12.8* 14.7  HCT 41.8 40.7  45.6  MCV 94.6 94.2 91.8  PLT 112* 100* 132*   Basic Metabolic Panel: Recent Labs  Lab 12/26/23 0934 12/27/23 0510 12/30/23 0419  NA 139 140 137  K 4.5 4.6 4.8  CL 102 104 102  CO2 23 29 26   GLUCOSE 111* 97 139*  BUN 21 22 22   CREATININE 1.45* 1.40* 1.15  CALCIUM 8.8* 8.7* 8.9   GFR: Estimated Creatinine Clearance: 66.7 mL/min (by C-G formula based on SCr of 1.15 mg/dL). Liver Function Tests: Recent Labs  Lab 12/26/23 0934  AST 76*  ALT 51*  ALKPHOS 57  BILITOT 0.4  PROT 7.4  ALBUMIN 3.9   No results for input(s): LIPASE, AMYLASE in the last 168 hours. No results for input(s): AMMONIA in the last 168 hours. Coagulation Profile: No  results for input(s): INR, PROTIME in the last 168 hours. Cardiac Enzymes: No results for input(s): CKTOTAL, CKMB, CKMBINDEX, TROPONINI in the last 168 hours. BNP (last 3 results) No results for input(s): PROBNP in the last 8760 hours. HbA1C: No results for input(s): HGBA1C in the last 72 hours. CBG: No results for input(s): GLUCAP in the last 168 hours. Lipid Profile: No results for input(s): CHOL, HDL, LDLCALC, TRIG, CHOLHDL, LDLDIRECT in the last 72 hours. Thyroid Function Tests: No results for input(s): TSH, T4TOTAL, FREET4, T3FREE, THYROIDAB in the last 72 hours. Anemia Panel: No results for input(s): VITAMINB12, FOLATE, FERRITIN, TIBC, IRON, RETICCTPCT in the last 72 hours. Sepsis Labs: Recent Labs  Lab 12/26/23 1042 12/26/23 1336  LATICACIDVEN 1.7 1.1    Recent Results (from the past 240 hours)  Resp panel by RT-PCR (RSV, Flu A&B, Covid) Anterior Nasal Swab     Status: Abnormal   Collection Time: 12/26/23  9:34 AM   Specimen: Anterior Nasal Swab  Result Value Ref Range Status   SARS Coronavirus 2 by RT PCR NEGATIVE NEGATIVE Final   Influenza A by PCR POSITIVE (A) NEGATIVE Final   Influenza B by PCR NEGATIVE NEGATIVE Final   Resp Syncytial Virus by PCR NEGATIVE NEGATIVE Final    Comment: Performed at [facility], [address], [place], [place] [address]  Blood culture (routine x 2)     Status: None   Collection Time: 12/26/23 10:42 AM   Specimen: Left Antecubital; Blood  Result Value Ref Range Status   Specimen Description   Final    LEFT ANTECUBITAL BOTTLES DRAWN AEROBIC AND ANAEROBIC   Special Requests Blood Culture adequate volume  Final   Culture   Final    NO GROWTH 5 DAYS Performed at [facility], [address], [place], [place] [address]    Report Status 12/31/2023 FINAL  Final  Blood culture (routine x 2)     Status: None   Collection Time: 12/26/23 10:42 AM   Specimen: BLOOD LEFT HAND  Result Value  Ref Range Status   Specimen Description   Final    BLOOD LEFT HAND BOTTLES DRAWN AEROBIC AND ANAEROBIC   Special Requests Blood Culture adequate volume  Final   Culture   Final    NO GROWTH 5 DAYS Performed at [facility], [address] [place], [place] [address]    Report Status 12/31/2023 FINAL  Final         Radiology Studies: No results found.         LOS: 6 days   Time spent= 35 mins    Deliliah Room, MD [facility]  If 7PM-7AM, please contact night-coverage  01/01/2024, 9:17 AM  "

## 2024-01-01 NOTE — Progress Notes (Signed)
 Mobility Specialist Progress Note:    01/01/24 1217  Mobility  Activity Pivoted/transferred from bed to chair  Level of Assistance Contact guard assist, steadying assist  Assistive Device None  Distance Ambulated (ft) 2 ft  Range of Motion/Exercises Active;All extremities  Activity Response Tolerated well  Mobility Referral Yes  Mobility visit 1 Mobility  Mobility Specialist Start Time (ACUTE ONLY) 1217  Mobility Specialist Stop Time (ACUTE ONLY) 1235  Mobility Specialist Time Calculation (min) (ACUTE ONLY) 18 min   Pt received supine, agreeable to mobility. Pt did not want to walk but agreed to sit up in chair. Required CGA to stand and pivot with no AD. Tolerated well, unsteady on feet. Alarm on, call bell in reach. All needs met.  Sharel Behne Mobility Specialist Please contact via Special Educational Needs Teacher or  Rehab office at [phone]
